# Patient Record
Sex: Male | Born: 1950
Health system: Southern US, Community
[De-identification: ages and names within clinical notes are randomized; demographics above are authoritative.]

## PROBLEM LIST (undated history)

## (undated) DIAGNOSIS — I2510 Atherosclerotic heart disease of native coronary artery without angina pectoris: Secondary | ICD-10-CM

## (undated) DIAGNOSIS — I10 Essential (primary) hypertension: Secondary | ICD-10-CM

## (undated) DIAGNOSIS — E785 Hyperlipidemia, unspecified: Secondary | ICD-10-CM

## (undated) DIAGNOSIS — C4491 Basal cell carcinoma of skin, unspecified: Secondary | ICD-10-CM

## (undated) DIAGNOSIS — M1990 Unspecified osteoarthritis, unspecified site: Secondary | ICD-10-CM

## (undated) HISTORY — PX: CORONARY ARTERY BYPASS GRAFT: SHX141

## (undated) HISTORY — PX: CARDIAC SURGERY: SHX584

## (undated) HISTORY — PX: OTHER SURGICAL HISTORY: SHX169

## (undated) HISTORY — DX: Essential (primary) hypertension: I10

## (undated) HISTORY — DX: Atherosclerotic heart disease of native coronary artery without angina pectoris: I25.10

## (undated) HISTORY — DX: Hyperlipidemia, unspecified: E78.5

## (undated) HISTORY — PX: VASECTOMY: SHX75

## (undated) HISTORY — PX: LASIK: SHX215

---
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
## 2011-01-07 ENCOUNTER — Inpatient Hospital Stay (HOSPITAL_COMMUNITY)
Admission: AD | Admit: 2011-01-07 | Discharge: 2011-01-13 | DRG: 234 | Disposition: A | Discharge status: Home / Self Care | Payer: Managed Care, Other (non HMO) | Source: Ambulatory Visit | Attending: Cardiothoracic Surgery | Admitting: Cardiothoracic Surgery

## 2011-01-07 ENCOUNTER — Inpatient Hospital Stay (HOSPITAL_COMMUNITY): Payer: Managed Care, Other (non HMO)

## 2011-01-07 DIAGNOSIS — I200 Unstable angina: Secondary | ICD-10-CM | POA: Diagnosis present

## 2011-01-07 DIAGNOSIS — I10 Essential (primary) hypertension: Secondary | ICD-10-CM | POA: Diagnosis present

## 2011-01-07 DIAGNOSIS — I2510 Atherosclerotic heart disease of native coronary artery without angina pectoris: Principal | ICD-10-CM | POA: Diagnosis present

## 2011-01-07 DIAGNOSIS — F411 Generalized anxiety disorder: Secondary | ICD-10-CM | POA: Diagnosis present

## 2011-01-07 DIAGNOSIS — R0602 Shortness of breath: Secondary | ICD-10-CM | POA: Diagnosis present

## 2011-01-07 DIAGNOSIS — H538 Other visual disturbances: Secondary | ICD-10-CM | POA: Diagnosis present

## 2011-01-07 DIAGNOSIS — D62 Acute posthemorrhagic anemia: Secondary | ICD-10-CM | POA: Diagnosis not present

## 2011-01-07 LAB — POCT I-STAT, CHEM 8
BUN: 7 mg/dL (ref 6–23)
Calcium, Ion: 1.12 mmol/L (ref 1.12–1.32)
Chloride: 105 mEq/L (ref 96–112)
HCT: 41 % (ref 39.0–52.0)
Sodium: 139 mEq/L (ref 135–145)

## 2011-01-07 LAB — BASIC METABOLIC PANEL
CO2: 24 mEq/L (ref 19–32)
Calcium: 9.1 mg/dL (ref 8.4–10.5)
Chloride: 104 mEq/L (ref 96–112)
GFR calc Af Amer: >60 mL/min (ref >60)
Glucose, Bld: 91 mg/dL (ref 70–99)
Potassium: 3.5 mEq/L (ref 3.5–5.1)
Sodium: 139 mEq/L (ref 135–145)

## 2011-01-07 LAB — CBC
HCT: 36.9 % — ABNORMAL LOW (ref 39.0–52.0)
Hemoglobin: 13.1 g/dL (ref 13.0–17.0)
MCH: 31.4 pg (ref 26.0–34.0)
MCV: 88.5 fL (ref 78.0–100.0)
Platelets: 242 10*3/uL (ref 150–400)
RBC: 4.17 MIL/uL — ABNORMAL LOW (ref 4.22–5.81)
WBC: 5.9 10*3/uL (ref 4.0–10.5)

## 2011-01-07 LAB — TSH: TSH: 1.443 u[IU]/mL (ref 0.350–4.500)

## 2011-01-07 LAB — BLOOD GAS, ARTERIAL
Acid-base deficit: 1.2 mmol/L (ref 0.0–2.0)
Bicarbonate: 22.3 mEq/L (ref 20.0–24.0)
O2 Saturation: 98.3 %
TCO2: 23.2 mmol/L (ref 0–100)
pCO2 arterial: 32.5 mmHg — ABNORMAL LOW (ref 35.0–45.0)
pO2, Arterial: 103 mmHg — ABNORMAL HIGH (ref 80.0–100.0)

## 2011-01-07 LAB — APTT: aPTT: 31 seconds (ref 24–37)

## 2011-01-07 LAB — CARDIAC PANEL(CRET KIN+CKTOT+MB+TROPI)
Relative Index: INVALID (ref 0.0–2.5)
Total CK: 131 U/L (ref 7–232)

## 2011-01-07 LAB — ABO/RH: ABO/RH(D): A POS

## 2011-01-07 LAB — PROTIME-INR: Prothrombin Time: 12.5 seconds (ref 11.6–15.2)

## 2011-01-07 LAB — TYPE AND SCREEN

## 2011-01-07 LAB — SURGICAL PCR SCREEN: MRSA, PCR: NEGATIVE

## 2011-01-07 LAB — POCT ACTIVATED CLOTTING TIME: Activated Clotting Time: 146 seconds

## 2011-01-08 ENCOUNTER — Inpatient Hospital Stay (HOSPITAL_COMMUNITY): Payer: Managed Care, Other (non HMO)

## 2011-01-08 DIAGNOSIS — I2510 Atherosclerotic heart disease of native coronary artery without angina pectoris: Secondary | ICD-10-CM

## 2011-01-08 DIAGNOSIS — Z01810 Encounter for preprocedural cardiovascular examination: Secondary | ICD-10-CM

## 2011-01-08 LAB — POCT I-STAT 3, ART BLOOD GAS (G3+)
Acid-base deficit: 2 mmol/L (ref 0.0–2.0)
Acid-base deficit: 5 mmol/L — ABNORMAL HIGH (ref 0.0–2.0)
Acid-base deficit: 3 mmol/L — ABNORMAL HIGH (ref 0.0–2.0)
Bicarbonate: 22.5 mEq/L (ref 20.0–24.0)
Bicarbonate: 22.5 mEq/L (ref 20.0–24.0)
O2 Saturation: 93 %
Patient temperature: 36.6
TCO2: 24 mmol/L (ref 0–100)
TCO2: 24 mmol/L (ref 0–100)
TCO2: 24 mmol/L (ref 0–100)
pCO2 arterial: 37.7 mmHg (ref 35.0–45.0)
pCO2 arterial: 31.7 mmHg — ABNORMAL LOW (ref 35.0–45.0)
pCO2 arterial: 39.5 mmHg (ref 35.0–45.0)
pCO2 arterial: 39.3 mmHg (ref 35.0–45.0)
pH, Arterial: 7.384 (ref 7.350–7.450)
pH, Arterial: 7.364 (ref 7.350–7.450)
pO2, Arterial: 306 mmHg — ABNORMAL HIGH (ref 80.0–100.0)
pO2, Arterial: 71 mmHg — ABNORMAL LOW (ref 80.0–100.0)
pO2, Arterial: 68 mmHg — ABNORMAL LOW (ref 80.0–100.0)
pO2, Arterial: 60 mmHg — ABNORMAL LOW (ref 80.0–100.0)

## 2011-01-08 LAB — URINALYSIS, ROUTINE W REFLEX MICROSCOPIC
Bilirubin Urine: NEGATIVE
Glucose, UA: NEGATIVE mg/dL
Hgb urine dipstick: NEGATIVE
Ketones, ur: NEGATIVE mg/dL
Nitrite: NEGATIVE
Protein, ur: NEGATIVE mg/dL
Specific Gravity, Urine: 1.014 (ref 1.005–1.030)
Urobilinogen, UA: 0.2 mg/dL (ref 0.0–1.0)
pH: 6 (ref 5.0–8.0)

## 2011-01-08 LAB — CREATININE, SERUM
Creatinine, Ser: 0.72 mg/dL (ref 0.4–1.5)
GFR calc Af Amer: >60 mL/min (ref >60)
GFR calc non Af Amer: >60 mL/min (ref >60)

## 2011-01-08 LAB — COMPREHENSIVE METABOLIC PANEL
ALT: 20 U/L (ref 0–53)
AST: 19 U/L (ref 0–37)
Albumin: 3.2 g/dL — ABNORMAL LOW (ref 3.5–5.2)
Alkaline Phosphatase: 72 U/L (ref 39–117)
BUN: 15 mg/dL (ref 6–23)
CO2: 26 mEq/L (ref 19–32)
Calcium: 9.1 mg/dL (ref 8.4–10.5)
Chloride: 105 mEq/L (ref 96–112)
Creatinine, Ser: 0.84 mg/dL (ref 0.4–1.5)
GFR calc Af Amer: >60 mL/min (ref >60)
GFR calc non Af Amer: >60 mL/min (ref >60)
Glucose, Bld: 101 mg/dL — ABNORMAL HIGH (ref 70–99)
Potassium: 4 mEq/L (ref 3.5–5.1)
Sodium: 139 mEq/L (ref 135–145)
Total Bilirubin: 0.2 mg/dL — ABNORMAL LOW (ref 0.3–1.2)
Total Protein: 6.5 g/dL (ref 6.0–8.3)

## 2011-01-08 LAB — CBC
HCT: 33.2 % — ABNORMAL LOW (ref 39.0–52.0)
HCT: 29.5 % — ABNORMAL LOW (ref 39.0–52.0)
Hemoglobin: 11.7 g/dL — ABNORMAL LOW (ref 13.0–17.0)
Hemoglobin: 10.4 g/dL — ABNORMAL LOW (ref 13.0–17.0)
MCH: 31.3 pg (ref 26.0–34.0)
MCH: 31 pg (ref 26.0–34.0)
MCHC: 35.2 g/dL (ref 30.0–36.0)
MCHC: 35.3 g/dL (ref 30.0–36.0)
MCV: 88.8 fL (ref 78.0–100.0)
MCV: 88.1 fL (ref 78.0–100.0)
Platelets: 265 10*3/uL (ref 150–400)
Platelets: 161 10*3/uL (ref 150–400)
Platelets: 154 10*3/uL (ref 150–400)
RBC: 4.45 MIL/uL (ref 4.22–5.81)
RBC: 3.74 MIL/uL — ABNORMAL LOW (ref 4.22–5.81)
RBC: 3.35 MIL/uL — ABNORMAL LOW (ref 4.22–5.81)
RDW: 12.7 % (ref 11.5–15.5)
RDW: 12.4 % (ref 11.5–15.5)
RDW: 12.4 % (ref 11.5–15.5)
WBC: 6.6 10*3/uL (ref 4.0–10.5)
WBC: 13.5 10*3/uL — ABNORMAL HIGH (ref 4.0–10.5)
WBC: 11.8 10*3/uL — ABNORMAL HIGH (ref 4.0–10.5)

## 2011-01-08 LAB — CARDIAC PANEL(CRET KIN+CKTOT+MB+TROPI)
CK, MB: 2.7 ng/mL (ref 0.3–4.0)
Relative Index: INVALID (ref 0.0–2.5)
Total CK: 64 U/L (ref 7–232)
Troponin I: <0.3 ng/mL (ref <0.30)

## 2011-01-08 LAB — POCT I-STAT 4, (NA,K, GLUC, HGB,HCT)
Glucose, Bld: 105 mg/dL — ABNORMAL HIGH (ref 70–99)
Glucose, Bld: 131 mg/dL — ABNORMAL HIGH (ref 70–99)
Glucose, Bld: 104 mg/dL — ABNORMAL HIGH (ref 70–99)
HCT: 36 % — ABNORMAL LOW (ref 39.0–52.0)
HCT: 35 % — ABNORMAL LOW (ref 39.0–52.0)
HCT: 29 % — ABNORMAL LOW (ref 39.0–52.0)
HCT: 31 % — ABNORMAL LOW (ref 39.0–52.0)
Hemoglobin: 11.9 g/dL — ABNORMAL LOW (ref 13.0–17.0)
Hemoglobin: 9.5 g/dL — ABNORMAL LOW (ref 13.0–17.0)
Hemoglobin: 10.5 g/dL — ABNORMAL LOW (ref 13.0–17.0)
Potassium: 3.6 mEq/L (ref 3.5–5.1)
Potassium: 4.4 mEq/L (ref 3.5–5.1)
Sodium: 140 mEq/L (ref 135–145)
Sodium: 134 mEq/L — ABNORMAL LOW (ref 135–145)
Sodium: 139 mEq/L (ref 135–145)
Sodium: 136 mEq/L (ref 135–145)

## 2011-01-08 LAB — LIPID PANEL
Cholesterol: 201 mg/dL — ABNORMAL HIGH (ref 0–200)
HDL: 35 mg/dL — ABNORMAL LOW (ref >39)
LDL Cholesterol: 140 mg/dL — ABNORMAL HIGH (ref 0–99)
Total CHOL/HDL Ratio: 5.7 RATIO
Triglycerides: 129 mg/dL (ref <150)
VLDL: 26 mg/dL (ref 0–40)

## 2011-01-08 LAB — PROTIME-INR
INR: 1.25 (ref 0.00–1.49)
Prothrombin Time: 15.9 seconds — ABNORMAL HIGH (ref 11.6–15.2)

## 2011-01-08 LAB — GLUCOSE, CAPILLARY

## 2011-01-08 LAB — POCT I-STAT, CHEM 8
BUN: 8 mg/dL (ref 6–23)
Creatinine, Ser: 0.9 mg/dL (ref 0.4–1.5)
Potassium: 4 mEq/L (ref 3.5–5.1)
Sodium: 138 mEq/L (ref 135–145)
TCO2: 23 mmol/L (ref 0–100)

## 2011-01-08 LAB — PLATELET COUNT: Platelets: 165 10*3/uL (ref 150–400)

## 2011-01-08 LAB — MAGNESIUM: Magnesium: 2.8 mg/dL — ABNORMAL HIGH (ref 1.5–2.5)

## 2011-01-08 LAB — HEMOGLOBIN A1C: Mean Plasma Glucose: 114 mg/dL (ref <117)

## 2011-01-08 LAB — APTT: aPTT: 35 seconds (ref 24–37)

## 2011-01-09 ENCOUNTER — Inpatient Hospital Stay (HOSPITAL_COMMUNITY): Payer: Managed Care, Other (non HMO)

## 2011-01-09 LAB — CBC
HCT: 28.8 % — ABNORMAL LOW (ref 39.0–52.0)
Hemoglobin: 10.3 g/dL — ABNORMAL LOW (ref 13.0–17.0)
Hemoglobin: 10 g/dL — ABNORMAL LOW (ref 13.0–17.0)
MCH: 31 pg (ref 26.0–34.0)
MCH: 31.3 pg (ref 26.0–34.0)
MCHC: 34.7 g/dL (ref 30.0–36.0)
MCV: 88.6 fL (ref 78.0–100.0)
MCV: 90.3 fL (ref 78.0–100.0)
Platelets: 167 10*3/uL (ref 150–400)
Platelets: 135 10*3/uL — ABNORMAL LOW (ref 150–400)
RBC: 3.32 MIL/uL — ABNORMAL LOW (ref 4.22–5.81)
RBC: 3.19 MIL/uL — ABNORMAL LOW (ref 4.22–5.81)
RDW: 12.9 % (ref 11.5–15.5)
WBC: 11.6 10*3/uL — ABNORMAL HIGH (ref 4.0–10.5)
WBC: 11.1 10*3/uL — ABNORMAL HIGH (ref 4.0–10.5)

## 2011-01-09 LAB — CREATININE, SERUM
Creatinine, Ser: 0.97 mg/dL (ref 0.4–1.5)
GFR calc Af Amer: >60 mL/min (ref >60)
GFR calc non Af Amer: >60 mL/min (ref >60)

## 2011-01-09 LAB — BASIC METABOLIC PANEL
CO2: 22 mEq/L (ref 19–32)
Calcium: 8 mg/dL — ABNORMAL LOW (ref 8.4–10.5)
Creatinine, Ser: 0.77 mg/dL (ref 0.4–1.5)
GFR calc Af Amer: >60 mL/min (ref >60)
GFR calc non Af Amer: >60 mL/min (ref >60)
Glucose, Bld: 116 mg/dL — ABNORMAL HIGH (ref 70–99)
Sodium: 136 mEq/L (ref 135–145)

## 2011-01-09 LAB — GLUCOSE, CAPILLARY
Glucose-Capillary: 108 mg/dL — ABNORMAL HIGH (ref 70–99)
Glucose-Capillary: 120 mg/dL — ABNORMAL HIGH (ref 70–99)
Glucose-Capillary: 128 mg/dL — ABNORMAL HIGH (ref 70–99)

## 2011-01-09 LAB — POCT I-STAT, CHEM 8
BUN: 13 mg/dL (ref 6–23)
Chloride: 102 mEq/L (ref 96–112)
Creatinine, Ser: 1.1 mg/dL (ref 0.4–1.5)
Sodium: 136 mEq/L (ref 135–145)

## 2011-01-09 LAB — MAGNESIUM: Magnesium: 2.5 mg/dL (ref 1.5–2.5)

## 2011-01-10 ENCOUNTER — Inpatient Hospital Stay (HOSPITAL_COMMUNITY): Payer: Managed Care, Other (non HMO)

## 2011-01-10 LAB — GLUCOSE, CAPILLARY: Glucose-Capillary: 114 mg/dL — ABNORMAL HIGH (ref 70–99)

## 2011-01-10 LAB — CBC
HCT: 27.3 % — ABNORMAL LOW (ref 39.0–52.0)
MCH: 31.2 pg (ref 26.0–34.0)
MCHC: 34.4 g/dL (ref 30.0–36.0)
MCV: 90.7 fL (ref 78.0–100.0)
RDW: 13 % (ref 11.5–15.5)

## 2011-01-10 LAB — BASIC METABOLIC PANEL
BUN: 13 mg/dL (ref 6–23)
Calcium: 8.3 mg/dL — ABNORMAL LOW (ref 8.4–10.5)
Creatinine, Ser: 0.89 mg/dL (ref 0.4–1.5)
GFR calc non Af Amer: >60 mL/min (ref >60)
Glucose, Bld: 110 mg/dL — ABNORMAL HIGH (ref 70–99)
Potassium: 3.7 mEq/L (ref 3.5–5.1)

## 2011-01-11 ENCOUNTER — Inpatient Hospital Stay (HOSPITAL_COMMUNITY): Payer: Managed Care, Other (non HMO)

## 2011-01-11 LAB — GLUCOSE, CAPILLARY
Glucose-Capillary: 107 mg/dL — ABNORMAL HIGH (ref 70–99)
Glucose-Capillary: 107 mg/dL — ABNORMAL HIGH (ref 70–99)

## 2011-01-11 LAB — CBC
Hemoglobin: 9.4 g/dL — ABNORMAL LOW (ref 13.0–17.0)
MCH: 31.4 pg (ref 26.0–34.0)
RBC: 2.99 MIL/uL — ABNORMAL LOW (ref 4.22–5.81)

## 2011-01-11 LAB — BASIC METABOLIC PANEL
CO2: 29 mEq/L (ref 19–32)
Chloride: 101 mEq/L (ref 96–112)
GFR calc Af Amer: >60 mL/min (ref >60)
Sodium: 136 mEq/L (ref 135–145)

## 2011-01-12 LAB — CBC
MCH: 31.4 pg (ref 26.0–34.0)
MCHC: 34.4 g/dL (ref 30.0–36.0)
Platelets: 209 10*3/uL (ref 150–400)
RBC: 3.22 MIL/uL — ABNORMAL LOW (ref 4.22–5.81)

## 2011-01-12 LAB — GLUCOSE, CAPILLARY: Glucose-Capillary: 112 mg/dL — ABNORMAL HIGH (ref 70–99)

## 2011-01-13 LAB — CBC
MCH: 31.5 pg (ref 26.0–34.0)
MCHC: 34.6 g/dL (ref 30.0–36.0)
Platelets: 280 10*3/uL (ref 150–400)
RBC: 3.4 MIL/uL — ABNORMAL LOW (ref 4.22–5.81)
RDW: 13 % (ref 11.5–15.5)

## 2011-01-15 LAB — GLUCOSE, CAPILLARY
Glucose-Capillary: 106 mg/dL — ABNORMAL HIGH (ref 70–99)
Glucose-Capillary: 108 mg/dL — ABNORMAL HIGH (ref 70–99)
Glucose-Capillary: 113 mg/dL — ABNORMAL HIGH (ref 70–99)
Glucose-Capillary: 118 mg/dL — ABNORMAL HIGH (ref 70–99)
Glucose-Capillary: 132 mg/dL — ABNORMAL HIGH (ref 70–99)

## 2011-01-16 NOTE — Op Note (Signed)
Derek Hayden, Derek NO.:  [id number]  MEDICAL RECORD NO.:  [id number]           PATIENT TYPE:  I  LOCATION:  2314                         FACILITY:  [facility]  PHYSICIAN:  Kerin Perna, M.D.  DATE OF BIRTH:  [birth year]  DATE OF PROCEDURE:  01/08/2011 DATE OF DISCHARGE:                              OPERATIVE REPORT   OPERATION: 1. Coronary artery bypass grafting x4 (left internal mammary artery to     LAD, saphenous vein graft to diagonal, saphenous vein graft to     obtuse marginal, saphenous vein graft to distal right coronary     artery/posterolateral). 2. Endoscopic harvest of right leg greater saphenous vein.  PREOPERATIVE DIAGNOSIS:  Severe left main and 3-vessel coronary artery disease with unstable angina.  POSTOPERATIVE DIAGNOSIS:  Severe left main and 3-vessel coronary artery disease with unstable angina.  SURGEON:  Kerin Perna, MD  ASSISTANT:  Sheliah Plane, MD and Doree Fudge, PA-C  ANESTHESIA:  General.  INDICATIONS:  The patient is a 60 year old Caucasian hypertensive male nonsmoker who presents with symptoms of unstable angina and was found by urgent cardiac cath to have significant 90% left main and severe 3- vessel disease with chronic occlusion of the LAD and circumflex.  His EF was 50%.  He is felt to be candidate for surgical revascularization.  I discussed the results of the cardiac cath with the patient and family and discussed the details of surgery including the indications, benefits, alternatives, and risks.  He understood the operation would be performed through a median sternotomy using cardiopulmonary bypass and general anesthesia.  He understood that the benefits would include prolonged survival and improved exercise tolerance and relief of angina. He understood the risk of the operation including bleeding, blood transfusion requirement, MI, stroke, infection, and death.  After reviewing these issues, he  agreed to proceed under what I felt was an informed consent.  OPERATIVE FINDINGS:  The right coronary was diffusely and heavily diseased with suboptimal target.  The distal graft was placed in the posterolateral branch of the distal right as the posterior descending was a small vessel with diffuse disease.  It also primarily coursed over the right ventricle and only crossed the septum at its very distal aspect.  The LAD and diagonal were fairly good targets but the chronically occluded distal circumflex was small but adequate.  The patient did not require any blood products for the surgery.  PROCEDURE:  The patient was brought to the operating room, placed supine on the operative table where general anesthesia was induced.  The chest, abdomen, and legs were prepped with Betadine and draped as a sterile field.  A sternal incision was made as the saphenous vein was harvested endoscopically from the right leg.  The left internal mammary artery was harvested as a pedicle graft from its origin at the subclavian vessels. It was a 1.5-mm vessel with excellent flow.  The sternal retractor was placed and heparin was administered.  The pericardium was opened and suspended.  Pursestrings were placed in the ascending aorta and right atrium and after the ACT had been documented as being  therapeutic and the vein was harvested, the patient was cannulated and placed on cardiopulmonary bypass.  The coronaries were identified for grafting and the mammary artery and vein grafts were prepared for the distal anastomoses.  Cardioplegic catheters were placed for both antegrade and retrograde cold blood cardioplegia.  The patient was cooled to 32 degrees and the aortic crossclamp was applied.  One liter of cold blood cardioplegia was delivered in split doses between the antegrade aortic and retrograde coronary sinus catheters. There was good cardioplegic arrest and septal temperature dropped less than 12  degrees.  Cardioplegia was delivered every 20 minutes or less while the crossclamp was placed.  The distal coronary anastomoses were then performed.  The first distal anastomosis was to the diagonal branch to LAD.  This was a 1.5-mm vessel, fairly good target, and a reverse saphenous vein was sewn end-to- side with running 7-0 Prolene with good flow through graft and cardioplegia was redosed through this graft to the anterior wall.  The second distal anastomosis was to the distal right coronary.  There was a early bifurcation of the posterior descending which coursed over the right ventricle and was small and had diffuse distal disease.  This was not grafted.  The graft was placed to the posterolateral branch of the distal right in an area of minimal plaque and there was an excellent patent vessel probe patent distally from the point of anastomosis distally.  A reverse saphenous vein at this point was sewn end-to-side with running 7-0 Prolene.  There was good flow through graft.  The third distal anastomosis was to the obtuse marginal branch of the left circumflex.  This was chronically occluded.  It was a 1.3 mm vessel. Reverse saphenous vein was then a side with running 7-0 Prolene with good flow through graft.  Cardioplegia was redosed.  The fourth distal anastomosis was the distal third of the LAD.  It was a 1.5-mm vessel and a fairly good target.  The left IMA pedicle was brought to an opening created in the left lateral pericardium and was brought down onto the LAD and sewn end-to-side with running 8-0 Prolene.  There was excellent flow through the anastomosis after briefly releasing the pedicle bulldog on the mammary artery.  The bulldog was reapplied and a pedicle was secured epicardium with 6-0 Prolene.  Cardioplegia was redosed.  While the crossclamp was still in place, 3 proximal vein anastomoses were performed on the ascending aorta using a 4.0-mm punch running 7-0 Prolene.   Prior to tying down the final proximal anastomosis, air was vented from the coronaries with a dose of retrograde warm blood cardioplegia.  The crossclamp was then removed.  The heart resumed a spontaneous rhythm.  The cardioplegic catheters were removed.  The proximal distal anastomoses were checked and found to be hemostatic.  Temporary pacing wires were applied.  While the patient was rewarmed to 37 degrees, the lungs re-expanded, the ventilator was resumed and the patient was weaned from bypass without difficulty on low-dose inotropes.  Cardiac output and blood pressure was stable.  The right ventricle at first appeared to be somewhat dilated and hypocontractile but then with further reperfusion became much more dynamic with good function.  Cardiac output was normal and protamine was administered without adverse reaction.  The cannula was removed.  The mediastinum was irrigated with warm saline.  The leg incision was irrigated and closed in a standard fashion.  The superior pericardial fat was closed over the aorta.  Two mediastinal and  left pleural chest tubes were placed and brought through separate incisions.  The sternum was closed with interrupted steel wire.  The pectoralis fascia and subcutaneous layers were closed with running Vicryl and sterile dressings were applied. Total bypass time was 140 minutes.     Kerin Perna, M.D.     PV/MEDQ  D:  01/08/2011  T:  01/09/2011  Job:  [id number]  cc:   Nanetta Batty, M.D. Larina Earthly, M.D.  Electronically Signed by Kerin Perna M.D. on 01/16/2011 09:45:46 AM

## 2011-01-16 NOTE — Cardiovascular Report (Signed)
NAMESHELLIE, GOETTL NO.:  [id number]  MEDICAL RECORD NO.:  [id number]           PATIENT TYPE:  I  LOCATION:  2914                         FACILITY:  [facility]  PHYSICIAN:  Nanetta Batty, M.D.   DATE OF BIRTH:  [birth year]  DATE OF PROCEDURE: DATE OF DISCHARGE:                           CARDIAC CATHETERIZATION   Mr. Delmonaco is a very pleasant 60 year old thin-appearing married Caucasian male with risk factors that include hypertension, who is very active and has complained of a 3-week history of exertional chest pain, shortness of breath and fatigue with decreased exercise tolerance.  He saw Dr. Felipa Eth today who documented anterolateral T-wave inversion.  He was admitted to the hospital for evaluation and cath.  DESCRIPTION OF PROCEDURE:  The patient was brought to the Second Floor [place] cardiac cath lab in postabsorptive state.  He was premedicated with p.o. Valium, IV Versed and fentanyl.  His right groin was prepped and shaved in the usual sterile fashion.  Xylocaine 1% was used for local anesthesia.  A 5-French sheath was inserted into the right femoral artery using standard Seldinger technique.  5-French right- left Judkins diagnostic catheter as well as 5-French pigtail catheter were used for selective coronary angiography, left ventriculography, subselective right and left internal mammary artery angiography and distal abdominal aortography.  Visipaque dye was used for the entirety of the case.  Retrograde aortic, left ventricular and pullback pressures were recorded.  HEMODYNAMICS: 1. Aortic systolic pressure 87, diastolic pressure 58. 2. Left ventricular systolic pressure 90, end-diastolic pressure 13.  SELECTIVE CORONARY ANGIOGRAPHY: 1. Left main; left main had a 90% tapered distal stenosis going into     the ostium and the circumflex and LAD both of which had 90% ostial     stenoses. 2. LAD; totalled after the first moderate-sized diagonal  branch and     septal perforator.  Filled by both right-to-left collaterals and     homocollaterals.  The diagonal branch had a 70% ostial stenosis as     well as 50% focal mid stenosis. 3. Left circumflex; occluded in its midportion with large distal     marginal branch filling by homocollaterals. 4. Right coronary artery; dominant with 70% mid, 60% distal, 90%     ostial PDA and 90% mid PDA.  The right system filled the LAD by     collaterals. 5. Left ventriculography; RAO left ventriculogram was performed using     25 mL of Visipaque dye at 12 mL per second.  The overall LVEF was     estimated approximately 50% with mild inferoapical hypokinesia. 6. Right and left internal mammary arteries; widely patent and     suitable for use during coronary artery bypass grafting. 7. Distal abdominal aortography; distal abdominal aortogram was     performed using 20 mL of Visipaque dye at 20 mL per second.  The     renal arteries were widely patent. The infrarenal abdominal aorta     and iliac bifurcation were free of significant atherosclerotic     changes.  IMPRESSION:  Mr. Sheils has high-grade left main three-vessel disease with preserved left  ventricular function.  He will need coronary artery bypass grafting either today or tomorrow by Dr. Kathlee Nations Trigt.  He has been notified of the case and is coming to see the patient.  Dr. Larina Earthly was notified of these results.     Nanetta Batty, M.D.     JB/MEDQ  D:  01/07/2011  T:  01/08/2011  Job:  [id number]  cc:   [facility] [place] [facility] Larina Earthly, M.D.  Electronically Signed by Nanetta Batty M.D. on 01/16/2011 03:43:33 PM

## 2011-01-16 NOTE — Consult Note (Signed)
NAMEDAN, SCEARCE NO.:  [id number]  MEDICAL RECORD NO.:  [id number]           PATIENT TYPE:  I  LOCATION:  2914                         FACILITY:  [facility]  PHYSICIAN:  Kerin Perna, M.D.  DATE OF BIRTH:  1950/10/02  DATE OF CONSULTATION:  01/07/2011 DATE OF DISCHARGE:                                CONSULTATION   REASON FOR CONSULTATION:  Severe left main and three-vessel coronary artery disease.  HISTORY OF PRESENT ILLNESS:  I was asked to evaluate this 60 year old Caucasian hypertensive male, nonsmoker who presents with recent 2-week history of exertional chest pain, shortness of breath, decreasing exercise tolerance, and increasing fatigue.  He has a history of hypertension and is a patient of Dr. Vicente Males.  Dr. Felipa Eth examined the patient in his office and obtained a 12-lead EKG, which showed new ST- segment changes with T-wave inversion in the precordial leads and T-wave inversion in the inferior leads.  He was sent directly to [facility]. Cardiac enzymes were checked and were negative.  He was in sinus rhythm. He underwent urgent cardiac catheterization by Dr. Nanetta Batty.  This demonstrated a 90% distal left main stenosis with 90% stenosis of the proximal LAD and circumflex.  The mid LAD and mid circumflex vessels were occluded.  They filled via collaterals from the right coronary artery.  There was a diagonal that had a ostial 70% to 80% stenosis as well.  The right coronary artery was dominant and was patent, but had a proximal mid 70% stenosis and a 90% stenosis at the takeoff of the posterior descending with some distal disease in the posterior descending as well.  EF was 50% with hypokinesia of the inferiorly apical wall.  LVEDP was normal.  There was no evidence of mitral regurgitation.  Based on the patient's symptoms and coronary anatomy, he was felt to be candidate for surgical evaluation and a thoracic surgical evaluation was  requested.  The patient was placed on IV heparin protocol as well as IV nitroglycerin.  PAST MEDICAL HISTORY: 1. Hypertension. 2. Allergy to ACE inhibitors (cough). 3. Prior surgical history including vasectomy and LASIK.  HOME MEDICATIONS:  Amlodipine 5 mg p.o. daily, aspirin 81 mg daily.  SOCIAL HISTORY:  The patient is married and works at [employer]. He does not smoke and uses alcohol occasionally.  FAMILY HISTORY:  His mother died of lymphoma at age 68.  His father has hypertension at age 45.  No history of premature coronary artery disease or severe hyperlipidemia.  REVIEW OF SYSTEMS:  CONSTITUTIONAL:  Negative for fever, weight loss. ENT:  Negative for change in vision, dental pain, or difficulty swallowing.  THORACIC:  Negative for history of thoracic trauma, abnormal chest x-ray, productive cough, or recent upper respiratory infection.  CARDIAC:  Positive for his angina.  No symptoms of CHF, murmur, or arrhythmia.  GI:  Negative for hepatitis, jaundice, or blood per rectum.  No history of previous colonoscopy.  UROLOGIC:  Negative for hematuria, kidney stones, or UTI.  VASCULAR:  Negative for DVT, claudication, or varicose veins.  NEUROLOGIC:  Negative stroke or seizure.  HEMATOLOGIC:  Negative bleeding disorder or blood transfusion. ENDOCRINE:  Negative diabetes or thyroid disease.  PHYSICAL EXAMINATION:  VITAL SIGNS:  The patient is 5 feet and 5 inches and weighs 195 pounds, blood pressure 140/80, pulse 80 in sinus, saturation 98% on room air, temperature 98.3. GENERAL APPEARANCE:  This is a very pleasant middle-aged Caucasian male in the CCU surrounded by family, no acute distress. HEENT:  Normocephalic.  Pupils are equal. NECK:  Without JVD, mass, or bruit. LYMPHATICS:  No palpable adenopathy in the neck or supraclavicular fossa. LUNGS:  Breath sounds are clear and there is no thoracic deformity. CARDIAC:  Regular rhythm without S3 gallop, murmur, or  rub. ABDOMEN:  Soft, nontender without pulsatile mass. EXTREMITIES:  No clubbing, cyanosis, or edema. VASCULAR:  Peripheral pulses are 2+ in all extremities.  No evidence of venous insufficiency in lower extremity. NEUROLOGIC:  Alert and oriented without focal motor deficit.  LABORATORY DATA:  I reviewed the coronary arteriogram with Dr. Allyson Sabal in the cath lab and he has severe multivessel disease with severe left main stenosis and fairly well-preserved LV function.  RECOMMENDATIONS:  He would benefit from surgical revascularization due to his severe coronary anatomy with bypass grafts to the LAD, diagonal, circumflex, and posterior descending.  I have discussed the procedure in detail with the patient including the indication, benefits, alternatives, and risks and he understands and agrees to proceed with surgery in the morning.     Kerin Perna, M.D.     PV/MEDQ  D:  01/07/2011  T:  01/07/2011  Job:  [id number]  cc:   Larina Earthly, M.D. Nanetta Batty, M.D.  Electronically Signed by Kerin Perna M.D. on 01/16/2011 09:45:38 AM

## 2011-01-16 NOTE — Discharge Summary (Signed)
Derek Hayden, Derek Hayden NO.:  [id number]  MEDICAL RECORD NO.:  [id number]           PATIENT TYPE:  I  LOCATION:  2005                         FACILITY:  [facility]  PHYSICIAN:  Kerin Perna, M.D.  DATE OF BIRTH:  18-Jun-1951  DATE OF ADMISSION:  01/07/2011 DATE OF DISCHARGE:                              DISCHARGE SUMMARY   PRIMARY ADMITTING DIAGNOSIS:  Chest pain.  ADDITIONAL/DISCHARGE DIAGNOSES: 1. Severe left main and three-vessel coronary artery disease. 2. Unstable angina. 3. History of hypertension. 4. Postoperative acute blood loss anemia.  PROCEDURES PERFORMED: 1. Cardiac catheterization. 2. Coronary artery bypass grafting x4 (left internal mammary artery to     the left anterior descending artery, saphenous vein graft to the     diagonal, saphenous vein graft to the obtuse marginal, saphenous     vein graft to the distal right coronary/posterolateral). 3. Endoscopic vein harvest, right leg.  HISTORY:  The patient is a 60 year old male with a known history of hypertension who is followed by Dr. Felipa Eth.  He recently presented with a 2-week history of exertional chest pain, shortness of breath, decreased exercise tolerance, and increased fatigue.  He was seen on the date of admission in Dr. Vicente Males office and a 12-lead EKG was performed, which showed new ST-segment changes with T-wave inversion in the precordial leads and T-wave inversion in the inferior leads.  He was sent directly to the Emergency Department at [facility] and cardiac enzymes were negative. He was taken to the Cardiac Cath Lab and underwent catheterization by Dr. Allyson Sabal.  This demonstrated a 90% distal left main stenosis with 90% stenosis of the proximal LAD and circumflex.  The mid LAD and mid circumflex vessels were occluded.  They filled via collaterals from the right coronary artery.  There was a diagonal that had a 70-80% ostial stenosis.  The right coronary artery was dominant and patent,  but had a proximal mid 70% stenosis and a 90% stenosis at the takeoff of the PD with some distal disease in the PD as well.  EF was 50% with hypokinesis of the inferior apical wall.  The patient was subsequently admitted for cardiac surgery consultation and workup.  HOSPITAL COURSE:  Mr. Cattell was admitted and was seen in consultation by Dr. Kathlee Nations Trigt.  Dr. Donata Clay reviewed his films and agreed with the need for coronary artery bypass grafting.  He explained all risks, benefits and alternatives of surgery to the patient.  He agreed to proceed.  He was taken to the operating room on Jan 08, 2011, and underwent coronary artery bypass grafting x4.  Please see previously dictated operative report for complete details of surgery.  He tolerated the procedure well and was transferred to the SICU in stable condition. He was able to be extubated shortly after surgery.  He was hemodynamically stable and doing well on postop day #1.  He did require low-dose pressors for hypotension, but these were weaned anddiscontinued late in the day of postop day #1.  By postop day #2, he was ready for transfer to the Step-Down Unit.  Overall, his postoperative course has  been uneventful.  He has maintained sinus rhythm, has been afebrile, and his vital signs have been stable.  He has been started on Lasix to which he is responding well for mild volume overload.  He is ambulating in the halls without difficulty.  He is tolerating a regular diet and is having normal bowel and bladder function.  His most recent labs show a hemoglobin of 10.1, hematocrit 29.4, platelets 209, white count 8.0.  Sodium 136, potassium 3.9, BUN 13, creatinine 0.84.  His latest chest x-ray shows small bilateral pleural effusions.  Overall, he is progressing well.  It is anticipated that if he remained stable over the next 24 hours, he will be ready for discharge home on Jan 13, 2011.  DISCHARGE MEDICATIONS: 1. Enteric-coated  aspirin 325 mg daily. 2. Lasix 40 mg daily x1 week. 3. Lopressor 12.5 mg b.i.d. 4. Oxycodone IR 5-10 mg q.3-4 h. p.r.n. for pain. 5. Potassium 20 mEq daily x7 days. 6. Crestor 20 mg daily. 7. Tylenol 325 three tablets daily p.r.n. for pain. 8. Ibuprofen 300 mg three tablets daily p.r.n. for pain. 9. Multivitamin daily.  DISCHARGE INSTRUCTIONS:  He is asked to refrain from driving, heavy lifting, or strenuous activity.  He may continue ambulating daily and using his incentive spirometer.  He may shower daily and clean his incisions with soap and water.  He will continue a low-fat, low-sodium diet.  DISCHARGE FOLLOWUP:  He will need to make an appointment to see Dr. Allyson Sabal in 2 weeks.  He will then follow up with Dr. Donata Clay in 3 weeks with a chest x-ray.  In the interim if he experiences any problems or has questions, he is asked to contact our office immediately.     Coral Ceo, P.A.   ______________________________ Kerin Perna, M.D.    GC/MEDQ  D:  01/12/2011  T:  01/12/2011  Job:  [id number]  cc:   Nanetta Batty, M.D. Larina Earthly, M.D.  Electronically Signed by Weldon Inches. on 01/14/2011 09:39:49 AM Electronically Signed by Kerin Perna M.D. on 01/16/2011 09:45:53 AM

## 2011-02-06 ENCOUNTER — Other Ambulatory Visit: Payer: Self-pay | Admitting: Cardiothoracic Surgery

## 2011-02-06 DIAGNOSIS — I2510 Atherosclerotic heart disease of native coronary artery without angina pectoris: Secondary | ICD-10-CM

## 2011-02-07 ENCOUNTER — Encounter: Payer: Managed Care, Other (non HMO) | Admitting: Cardiothoracic Surgery

## 2011-02-07 ENCOUNTER — Encounter (INDEPENDENT_AMBULATORY_CARE_PROVIDER_SITE_OTHER): Payer: Self-pay | Admitting: Cardiothoracic Surgery

## 2011-02-07 ENCOUNTER — Ambulatory Visit
Admission: RE | Admit: 2011-02-07 | Discharge: 2011-02-07 | Disposition: A | Discharge status: Home / Self Care | Payer: Managed Care, Other (non HMO) | Source: Ambulatory Visit | Attending: Cardiothoracic Surgery | Admitting: Cardiothoracic Surgery

## 2011-02-07 DIAGNOSIS — I2510 Atherosclerotic heart disease of native coronary artery without angina pectoris: Secondary | ICD-10-CM

## 2011-02-17 ENCOUNTER — Encounter (HOSPITAL_COMMUNITY): Payer: Managed Care, Other (non HMO) | Attending: Cardiovascular Disease

## 2011-02-17 ENCOUNTER — Encounter (HOSPITAL_COMMUNITY): Payer: Managed Care, Other (non HMO)

## 2011-02-17 ENCOUNTER — Ambulatory Visit (HOSPITAL_COMMUNITY): Payer: Managed Care, Other (non HMO)

## 2011-02-17 DIAGNOSIS — Z951 Presence of aortocoronary bypass graft: Secondary | ICD-10-CM | POA: Insufficient documentation

## 2011-02-17 DIAGNOSIS — F411 Generalized anxiety disorder: Secondary | ICD-10-CM | POA: Insufficient documentation

## 2011-02-17 DIAGNOSIS — I10 Essential (primary) hypertension: Secondary | ICD-10-CM | POA: Insufficient documentation

## 2011-02-17 DIAGNOSIS — I2510 Atherosclerotic heart disease of native coronary artery without angina pectoris: Secondary | ICD-10-CM | POA: Insufficient documentation

## 2011-02-17 DIAGNOSIS — I200 Unstable angina: Secondary | ICD-10-CM | POA: Insufficient documentation

## 2011-02-17 DIAGNOSIS — Z5189 Encounter for other specified aftercare: Secondary | ICD-10-CM | POA: Insufficient documentation

## 2011-02-19 ENCOUNTER — Encounter (HOSPITAL_COMMUNITY): Payer: Managed Care, Other (non HMO)

## 2011-02-21 ENCOUNTER — Encounter (HOSPITAL_COMMUNITY): Payer: Managed Care, Other (non HMO)

## 2011-02-24 ENCOUNTER — Encounter (HOSPITAL_COMMUNITY): Payer: Managed Care, Other (non HMO) | Attending: Cardiovascular Disease

## 2011-02-24 ENCOUNTER — Encounter (HOSPITAL_COMMUNITY): Payer: Managed Care, Other (non HMO)

## 2011-02-24 DIAGNOSIS — Z5189 Encounter for other specified aftercare: Secondary | ICD-10-CM | POA: Insufficient documentation

## 2011-02-24 DIAGNOSIS — I2510 Atherosclerotic heart disease of native coronary artery without angina pectoris: Secondary | ICD-10-CM | POA: Insufficient documentation

## 2011-02-24 DIAGNOSIS — I200 Unstable angina: Secondary | ICD-10-CM | POA: Insufficient documentation

## 2011-02-24 DIAGNOSIS — Z951 Presence of aortocoronary bypass graft: Secondary | ICD-10-CM | POA: Insufficient documentation

## 2011-02-24 DIAGNOSIS — I10 Essential (primary) hypertension: Secondary | ICD-10-CM | POA: Insufficient documentation

## 2011-02-24 DIAGNOSIS — F411 Generalized anxiety disorder: Secondary | ICD-10-CM | POA: Insufficient documentation

## 2011-02-26 ENCOUNTER — Encounter (HOSPITAL_COMMUNITY): Payer: Managed Care, Other (non HMO)

## 2011-02-28 ENCOUNTER — Encounter (HOSPITAL_COMMUNITY): Payer: Managed Care, Other (non HMO)

## 2011-03-03 ENCOUNTER — Encounter (HOSPITAL_COMMUNITY): Payer: Managed Care, Other (non HMO)

## 2011-03-04 NOTE — Assessment & Plan Note (Addendum)
OFFICE VISIT  Derek Hayden, Derek Hayden DOB:  [birth year]                                        February 09, 2011 CHART #:  [id number]  CURRENT PROBLEMS: 1. Status post coronary artery bypass grafting x4 on Jan 08, 2011, for     unstable angina and three-vessel disease. 2. Hypertension.  PRESENT ILLNESS:  Derek Hayden returns for his postop visit after undergoing multivessel vessel bypass grafting a month ago for unstable angina and three-vessel disease.  He has done well at home and is walking 50 minutes twice a day without recurrent angina.  The surgical incisions are healing well.  He remains on his discharge medications which includes Lopressor recently increased to 12.5 mg p.o. t.i.d. by Dr. Allyson Sabal, aspirin, Crestor 20 mg, multivitamin, and Cozaar 25 mg a day. He also takes aspirin 325 a day.  PHYSICAL EXAMINATION:  VITAL SIGNS:  Blood pressure 130/90, pulse 100, respirations 18, and saturation 98%. GENERAL:  He is alert and pleasant. LUNGS:  Breath sounds are clear and equal. HEART:  Cardiac rhythm is regular without murmur. SKIN:  The chest and leg incision is well healed.  There is no peripheral edema.  PA and lateral chest x-ray reveals clear lung fields.  No significant pleural effusion.  His cardiac silhouette is normal and stable.  PLAN:  The patient was told he could resume driving with activities up to 15-20 pounds.  He will enter cardiac rehab.  He has a very physical job at [employer] and before he returns to work we will reexamine the patient in August 2012.  Kerin Perna, M.D. Electronically Signed  PV/MEDQ  D:  02/09/2011  T:  02/09/2011  Job:  [id number]  cc:   Nanetta Batty, M.D.

## 2011-03-05 ENCOUNTER — Encounter (HOSPITAL_COMMUNITY): Payer: Managed Care, Other (non HMO)

## 2011-03-07 ENCOUNTER — Encounter (HOSPITAL_COMMUNITY): Payer: Managed Care, Other (non HMO)

## 2011-03-10 ENCOUNTER — Encounter (HOSPITAL_COMMUNITY): Payer: Managed Care, Other (non HMO)

## 2011-03-12 ENCOUNTER — Encounter (HOSPITAL_COMMUNITY): Payer: Managed Care, Other (non HMO)

## 2011-03-14 ENCOUNTER — Encounter (HOSPITAL_COMMUNITY): Payer: Managed Care, Other (non HMO)

## 2011-03-17 ENCOUNTER — Encounter (HOSPITAL_COMMUNITY): Payer: Managed Care, Other (non HMO)

## 2011-03-19 ENCOUNTER — Encounter (HOSPITAL_COMMUNITY): Payer: Managed Care, Other (non HMO)

## 2011-03-21 ENCOUNTER — Encounter (HOSPITAL_COMMUNITY): Payer: Managed Care, Other (non HMO)

## 2011-03-24 ENCOUNTER — Encounter (HOSPITAL_COMMUNITY): Payer: Managed Care, Other (non HMO)

## 2011-03-26 ENCOUNTER — Encounter (HOSPITAL_COMMUNITY): Admission: RE | Admit: 2011-03-26 | Payer: Managed Care, Other (non HMO) | Source: Ambulatory Visit

## 2011-03-26 ENCOUNTER — Encounter (HOSPITAL_COMMUNITY): Payer: Managed Care, Other (non HMO)

## 2011-03-26 DIAGNOSIS — I10 Essential (primary) hypertension: Secondary | ICD-10-CM | POA: Insufficient documentation

## 2011-03-26 DIAGNOSIS — I200 Unstable angina: Secondary | ICD-10-CM | POA: Insufficient documentation

## 2011-03-26 DIAGNOSIS — Z5189 Encounter for other specified aftercare: Secondary | ICD-10-CM | POA: Insufficient documentation

## 2011-03-26 DIAGNOSIS — Z951 Presence of aortocoronary bypass graft: Secondary | ICD-10-CM | POA: Insufficient documentation

## 2011-03-26 DIAGNOSIS — F411 Generalized anxiety disorder: Secondary | ICD-10-CM | POA: Insufficient documentation

## 2011-03-26 DIAGNOSIS — I2510 Atherosclerotic heart disease of native coronary artery without angina pectoris: Secondary | ICD-10-CM | POA: Insufficient documentation

## 2011-03-28 ENCOUNTER — Encounter (HOSPITAL_COMMUNITY): Payer: Managed Care, Other (non HMO) | Attending: Cardiovascular Disease

## 2011-03-28 ENCOUNTER — Encounter (HOSPITAL_COMMUNITY): Payer: Managed Care, Other (non HMO)

## 2011-03-31 ENCOUNTER — Encounter (HOSPITAL_COMMUNITY): Payer: Managed Care, Other (non HMO)

## 2011-04-02 ENCOUNTER — Encounter (HOSPITAL_COMMUNITY): Payer: Managed Care, Other (non HMO)

## 2011-04-02 ENCOUNTER — Encounter (INDEPENDENT_AMBULATORY_CARE_PROVIDER_SITE_OTHER): Payer: Self-pay | Admitting: Cardiothoracic Surgery

## 2011-04-02 DIAGNOSIS — I2510 Atherosclerotic heart disease of native coronary artery without angina pectoris: Secondary | ICD-10-CM

## 2011-04-03 HISTORY — PX: OTHER SURGICAL HISTORY: SHX169

## 2011-04-03 NOTE — Assessment & Plan Note (Signed)
OFFICE VISIT  Derek Hayden, Derek Hayden DOB:  [birth year]                                        April 02, 2011 CHART #:  [id number]  PRESENT ILLNESS:  The patient is very nice 60 year old gentleman who returns for his final postop office visit 3 months after undergoing urgent multivessel bypass grafting for severe three-vessel coronary disease with unstable angina.  He has done well following surgery with good exercise tolerance and is undergoing cardiac rehab which he enjoys. He walks daily as well.  He has had no angina or symptoms of CHF.  The surgical incisions are well healed.  He remains on his excellent cardiac medications including Lopressor 12.5 b.i.d., aspirin 325 daily, Crestor 20 mg a day and Cozaar 25 mg daily.  PHYSICAL EXAMINATION:  Blood pressure is 110/70, pulse 80, saturation 98%.  He is alert and comfortable.  Breath sounds are clear.  The sternum is stable and well healed.  Cardiac rhythm is regular without gallop or rub.  There is no pedal edema.  IMPRESSION AND PLAN:  We had a long discussion about his return to work. His current job description requires lifting in excess of 50 pounds with stooping and exposure to extreme heat with proximity to a 500-degree furnace.  This type of job really is unrealistic for a 60 year old man after heart bypass surgery, even though he is feeling well now.  He was encouraged to try to find a different job description.  Otherwise, he can resume with his rehab activities and gradually increase his exercise level and lifting amounts.  I will see him back as needed.  He has a stress test scheduled with Dr. Allyson Sabal tomorrow.  Kerin Perna, M.D. Electronically Signed  PV/MEDQ  D:  04/02/2011  T:  04/03/2011  Job:  [id number]  cc:   Nanetta Batty, M.D.

## 2011-04-04 ENCOUNTER — Encounter (HOSPITAL_COMMUNITY): Payer: Managed Care, Other (non HMO)

## 2011-04-07 ENCOUNTER — Ambulatory Visit (AMBULATORY_SURGERY_CENTER): Payer: Managed Care, Other (non HMO) | Admitting: *Deleted

## 2011-04-07 ENCOUNTER — Telehealth: Payer: Self-pay | Admitting: *Deleted

## 2011-04-07 ENCOUNTER — Encounter (HOSPITAL_COMMUNITY): Payer: Managed Care, Other (non HMO)

## 2011-04-07 VITALS — Ht 65.0 in | Wt 188.5 lb

## 2011-04-07 DIAGNOSIS — Z1211 Encounter for screening for malignant neoplasm of colon: Secondary | ICD-10-CM

## 2011-04-07 MED ORDER — PEG-KCL-NACL-NASULF-NA ASC-C 100 G PO SOLR: 1.0000 | Freq: Once | ORAL | Status: DC

## 2011-04-07 NOTE — Telephone Encounter (Signed)
Dr. Marina Goodell, I just wanted to let you know this pt had a recent CABG x4 on 01-08-11.  He is only on ASA 162mg  daily; no blood thinners.  I just wanted you to be aware before his procedure.  His colonoscopy is 04-17-11.  Thanks, [employer]

## 2011-04-08 NOTE — Telephone Encounter (Signed)
Ok thanks 

## 2011-04-09 ENCOUNTER — Encounter (HOSPITAL_COMMUNITY): Payer: Managed Care, Other (non HMO)

## 2011-04-11 ENCOUNTER — Encounter (HOSPITAL_COMMUNITY): Payer: Managed Care, Other (non HMO)

## 2011-04-14 ENCOUNTER — Encounter (HOSPITAL_COMMUNITY): Payer: Managed Care, Other (non HMO)

## 2011-04-16 ENCOUNTER — Encounter (HOSPITAL_COMMUNITY): Payer: Managed Care, Other (non HMO)

## 2011-04-17 ENCOUNTER — Encounter: Payer: Self-pay | Admitting: Internal Medicine

## 2011-04-17 ENCOUNTER — Ambulatory Visit (AMBULATORY_SURGERY_CENTER): Payer: Managed Care, Other (non HMO) | Admitting: Internal Medicine

## 2011-04-17 DIAGNOSIS — K5730 Diverticulosis of large intestine without perforation or abscess without bleeding: Secondary | ICD-10-CM

## 2011-04-17 DIAGNOSIS — D126 Benign neoplasm of colon, unspecified: Secondary | ICD-10-CM

## 2011-04-17 DIAGNOSIS — Z1211 Encounter for screening for malignant neoplasm of colon: Secondary | ICD-10-CM

## 2011-04-17 MED ORDER — SODIUM CHLORIDE 0.9 % IV SOLN: 500.0000 mL | INTRAVENOUS | Status: DC

## 2011-04-17 NOTE — Patient Instructions (Signed)
Read the handouts given to you by your recovery room nurse.    Your biopsy results will be mailed to you within 2 weeks.   Increase your fiber in your diet due to the diverticulosis in your colon and your hemorrhoids.   Resume your routine medications today.   If you have any questions, please call us at [phone].  Thanks-you.

## 2011-04-18 ENCOUNTER — Encounter (HOSPITAL_COMMUNITY): Payer: Managed Care, Other (non HMO)

## 2011-04-18 ENCOUNTER — Telehealth: Payer: Self-pay | Admitting: *Deleted

## 2011-04-18 NOTE — Telephone Encounter (Signed)

## 2011-04-21 ENCOUNTER — Encounter (HOSPITAL_COMMUNITY): Payer: Managed Care, Other (non HMO)

## 2011-04-23 ENCOUNTER — Encounter (HOSPITAL_COMMUNITY): Payer: Managed Care, Other (non HMO)

## 2011-04-25 ENCOUNTER — Encounter (HOSPITAL_COMMUNITY): Payer: Managed Care, Other (non HMO)

## 2011-04-28 ENCOUNTER — Encounter (HOSPITAL_COMMUNITY): Payer: Managed Care, Other (non HMO)

## 2011-04-30 ENCOUNTER — Encounter (HOSPITAL_COMMUNITY): Payer: Managed Care, Other (non HMO)

## 2011-05-02 ENCOUNTER — Encounter (HOSPITAL_COMMUNITY): Payer: Managed Care, Other (non HMO)

## 2011-05-05 ENCOUNTER — Encounter (HOSPITAL_COMMUNITY): Payer: Managed Care, Other (non HMO)

## 2011-05-07 ENCOUNTER — Encounter (HOSPITAL_COMMUNITY): Payer: Managed Care, Other (non HMO)

## 2011-05-09 ENCOUNTER — Encounter (HOSPITAL_COMMUNITY): Payer: Managed Care, Other (non HMO)

## 2011-05-12 ENCOUNTER — Encounter (HOSPITAL_COMMUNITY): Payer: Managed Care, Other (non HMO)

## 2011-05-14 ENCOUNTER — Encounter (HOSPITAL_COMMUNITY): Payer: Managed Care, Other (non HMO)

## 2011-05-16 ENCOUNTER — Encounter (HOSPITAL_COMMUNITY): Payer: Managed Care, Other (non HMO)

## 2011-05-19 ENCOUNTER — Encounter (HOSPITAL_COMMUNITY): Payer: Managed Care, Other (non HMO)

## 2011-05-21 ENCOUNTER — Encounter (HOSPITAL_COMMUNITY): Payer: Managed Care, Other (non HMO)

## 2011-05-23 ENCOUNTER — Encounter (HOSPITAL_COMMUNITY): Payer: Managed Care, Other (non HMO)

## 2012-04-29 IMAGING — CR DG CHEST 1V PORT
1 series · 1 of 1 positions shown · non-contrast
Comparison: Portable exam 8167 hours compared to earlier study of
01/08/2011

CLINICAL DATA: Coronary artery disease post CABG

PORTABLE CHEST - 1 VIEW

[view not recorded]
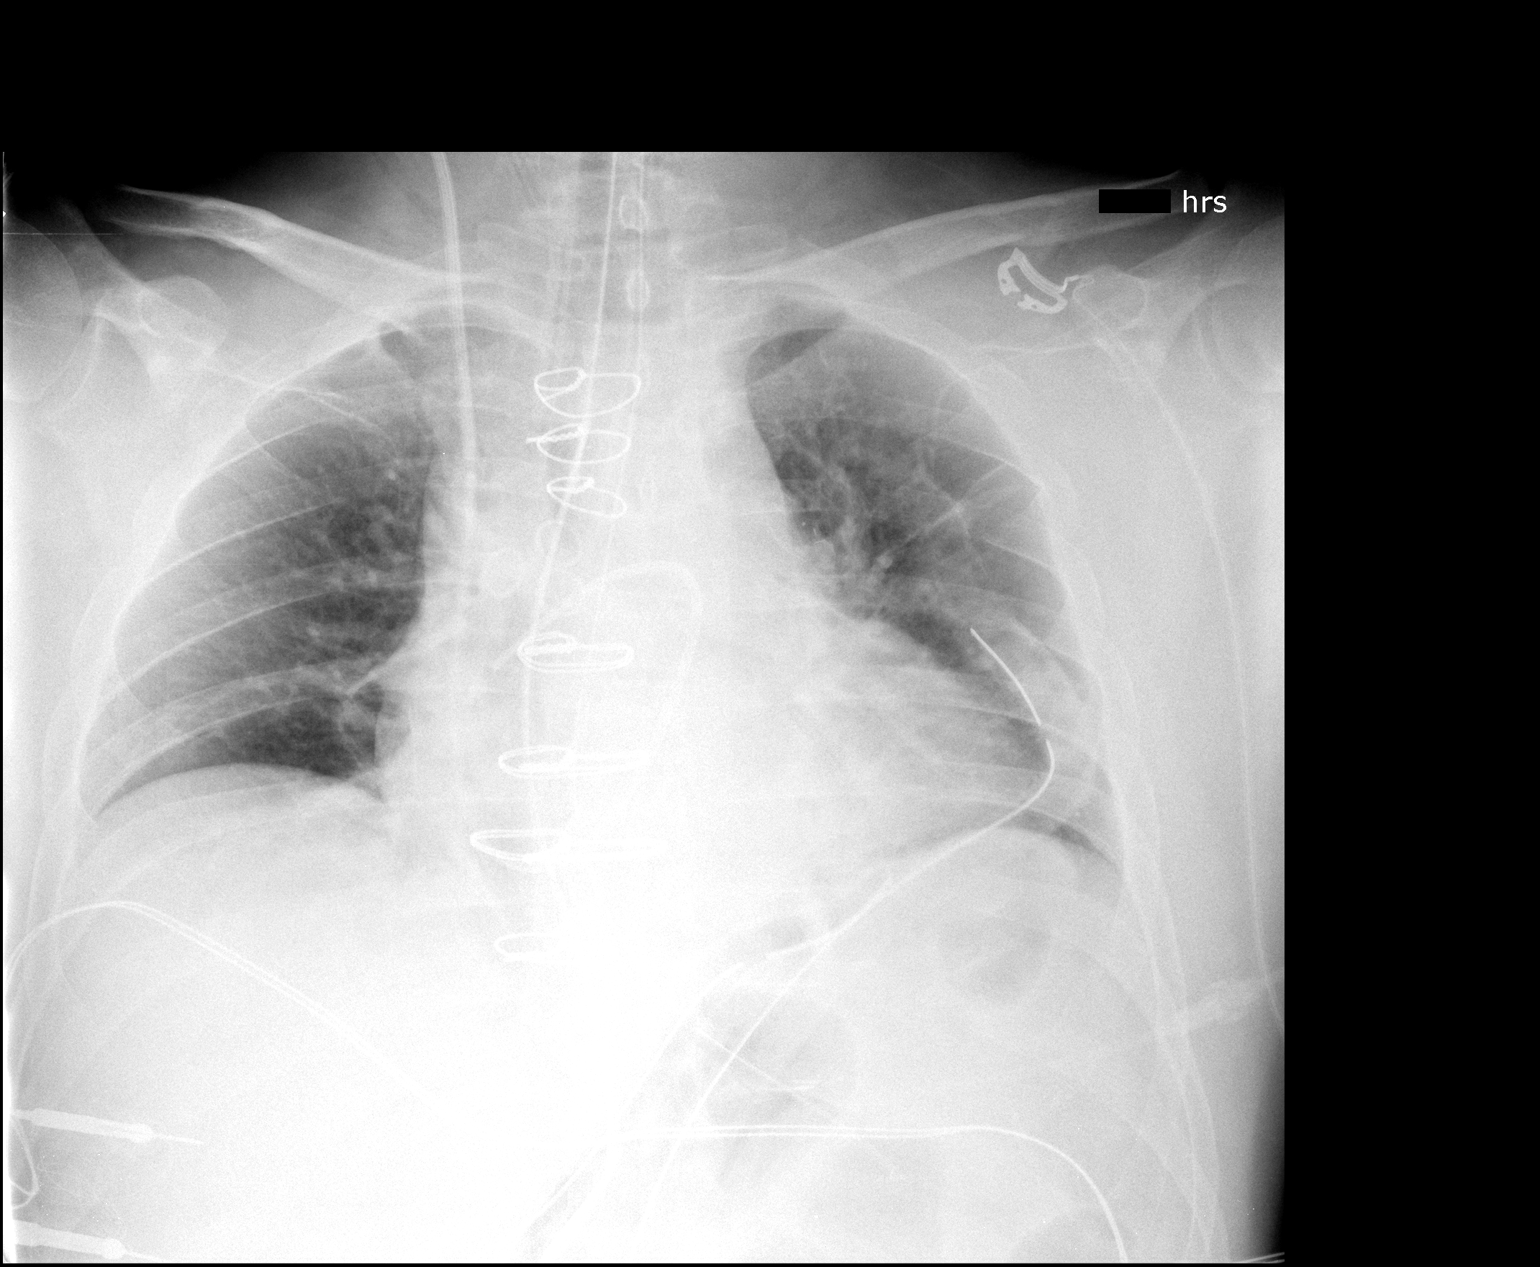

[1 of 1 positions shown; findings below may reference images not displayed]

FINDINGS: Tip of endotracheal tube 2.6 cm above carina.
Nasogastric tube extends into stomach.
Mediastinal drain and left thoracostomy tube present.
Right jugular Swan-Ganz catheter, tip at descending interlobar
right pulmonary artery.
Decreased lung volumes with atelectasis in left mid lung and at
right base.
No definite pneumothorax.
Cardiac silhouette and mediastinum are prominent compatible with
preceding surgery.
Probable skin fold lower right chest.
IMPRESSION: Postoperative changes as above.

## 2012-04-29 IMAGING — CR DG CERVICAL SPINE FLEX&EXT ONLY
2 series · 2 of 2 positions shown · non-contrast
Comparison: None

CLINICAL DATA: Neck pain with right arm pain

CERVICAL SPINE - FLEXION AND EXTENSION VIEWS ONLY

[w c-spine flexion *]
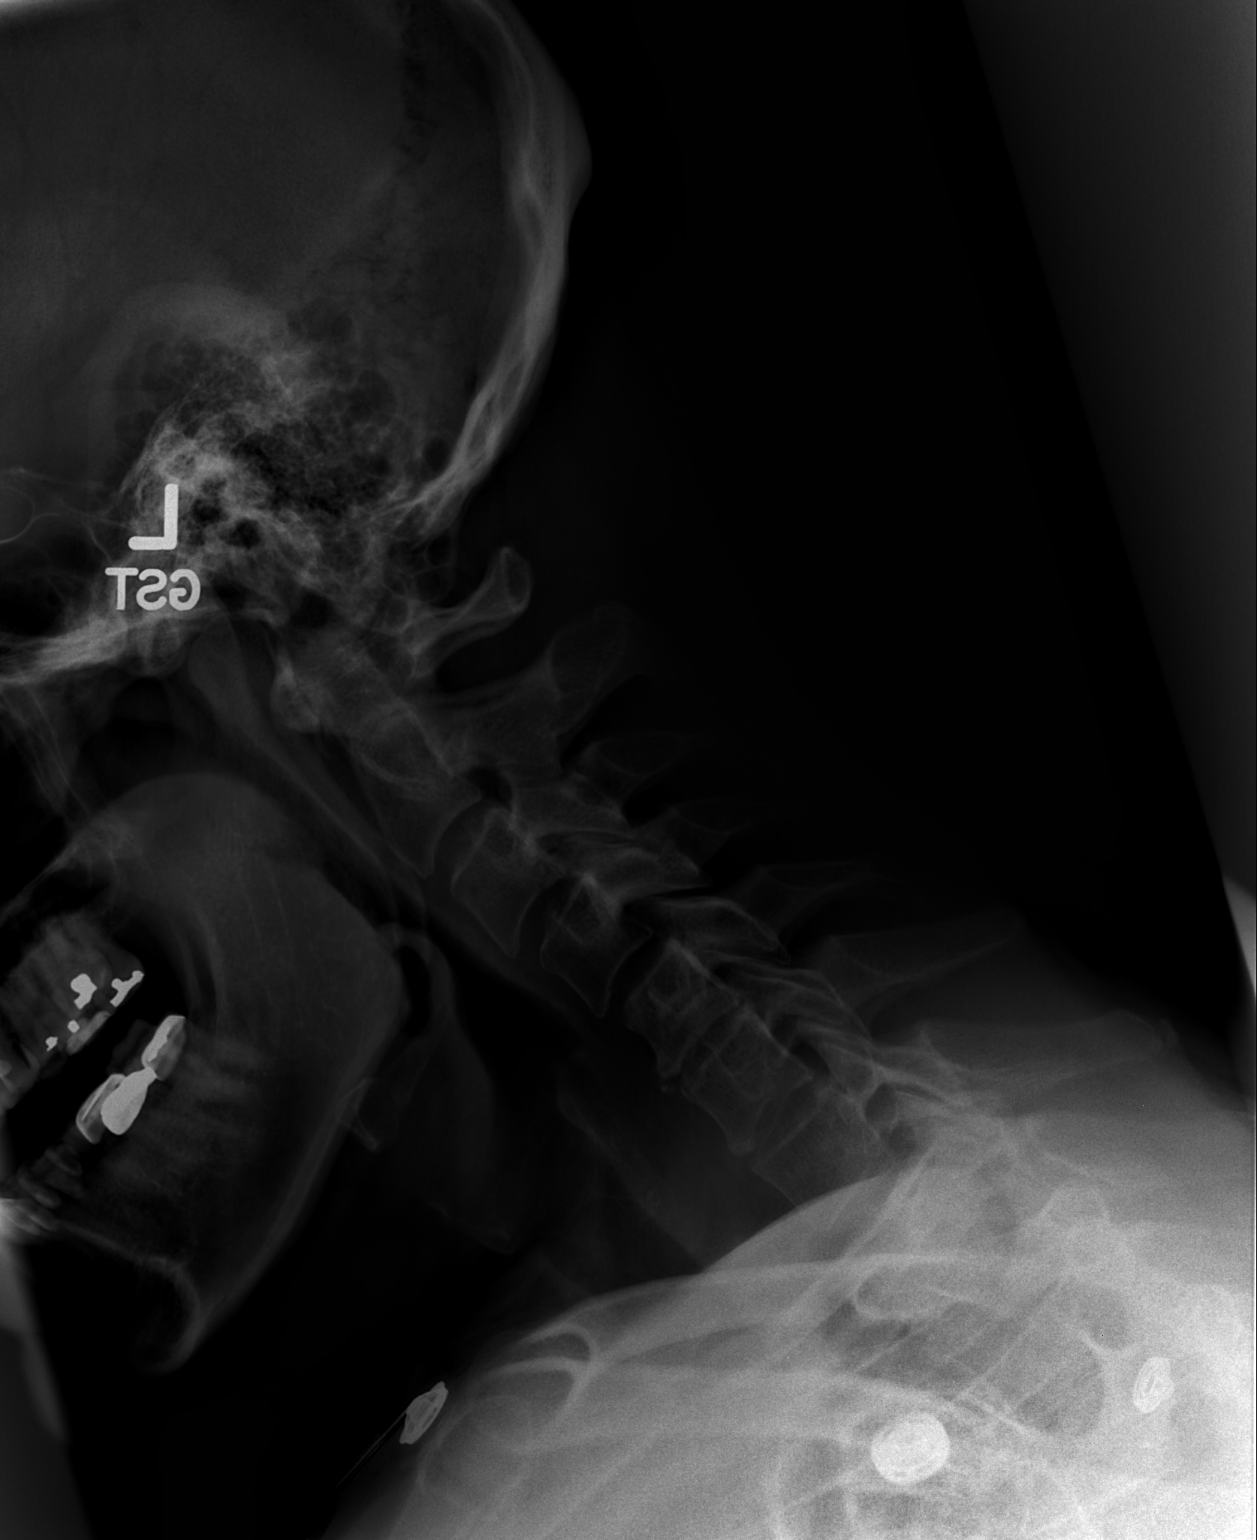

[w c-spine extension *]
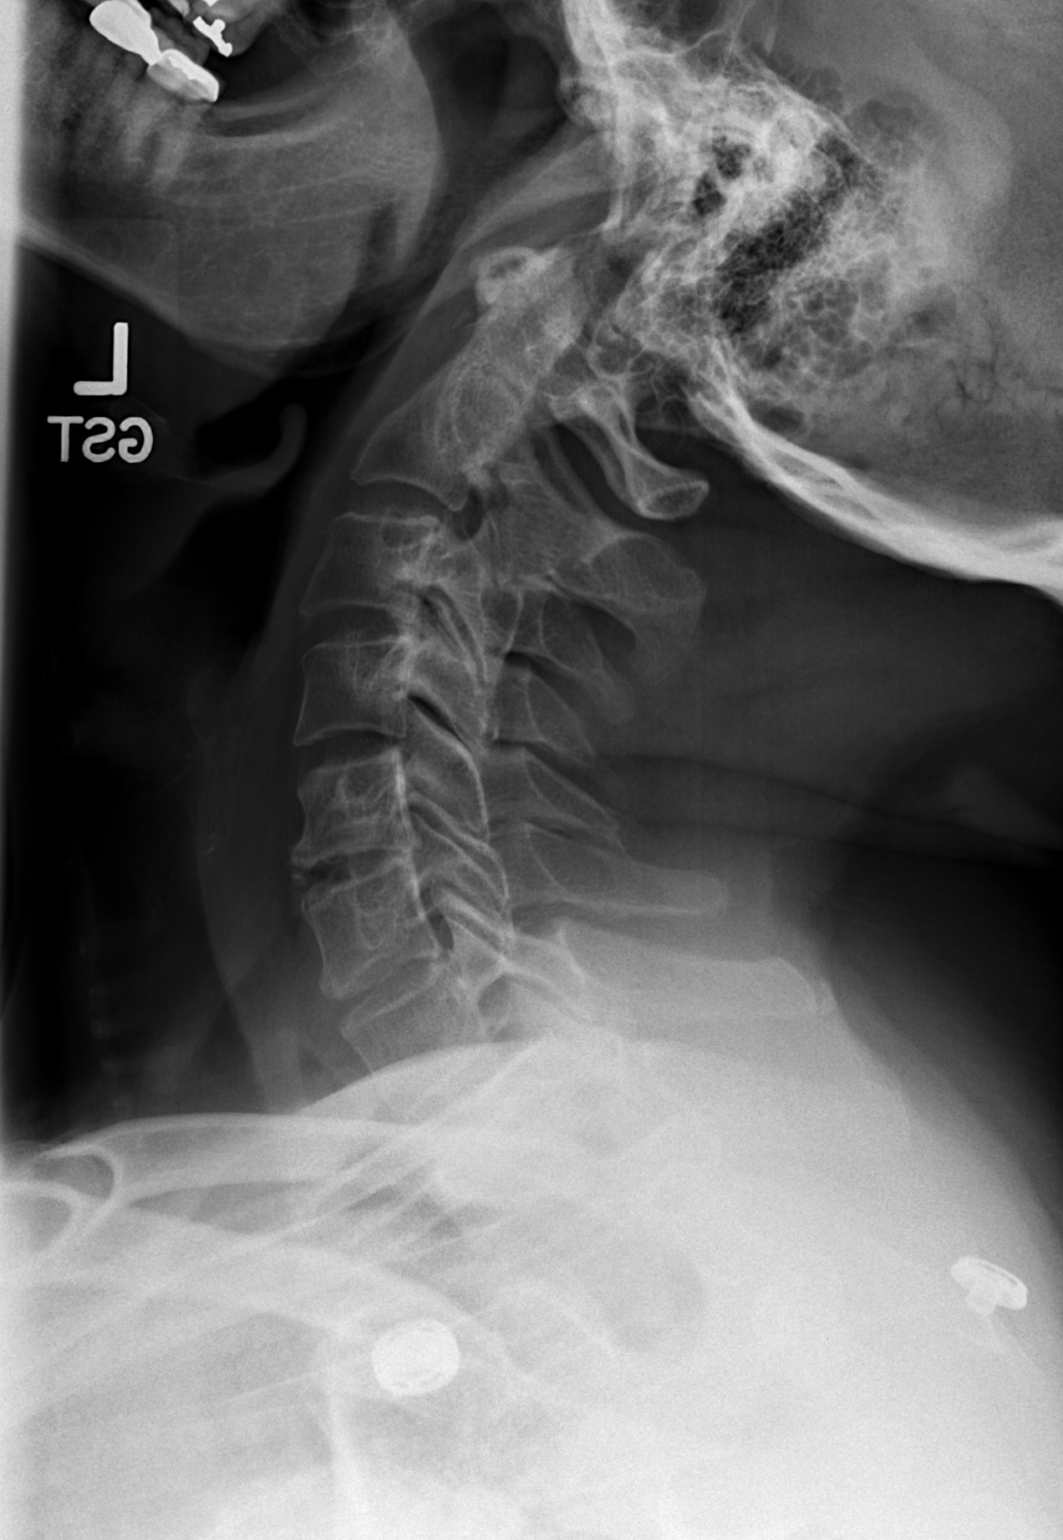

[2 of 2 positions shown; findings below may reference images not displayed]

FINDINGS: Disc degeneration and spurring C5-6.

Mild anterior subluxation C3 on C4 and C4 on C5 with flexion.  This
reduces on extension and  is most likely physiologic in nature.  No
fracture is identified.
IMPRESSION: Disc degeneration and spondylosis C5-6.  Physiologic movement at C3-
4 and C4-5.

## 2012-04-29 IMAGING — CR DG CHEST 2V
2 series · 2 of 2 positions shown · non-contrast
Comparison: None

CLINICAL DATA: Preop open heart surgery

CHEST - 2 VIEW

[w chest pa]
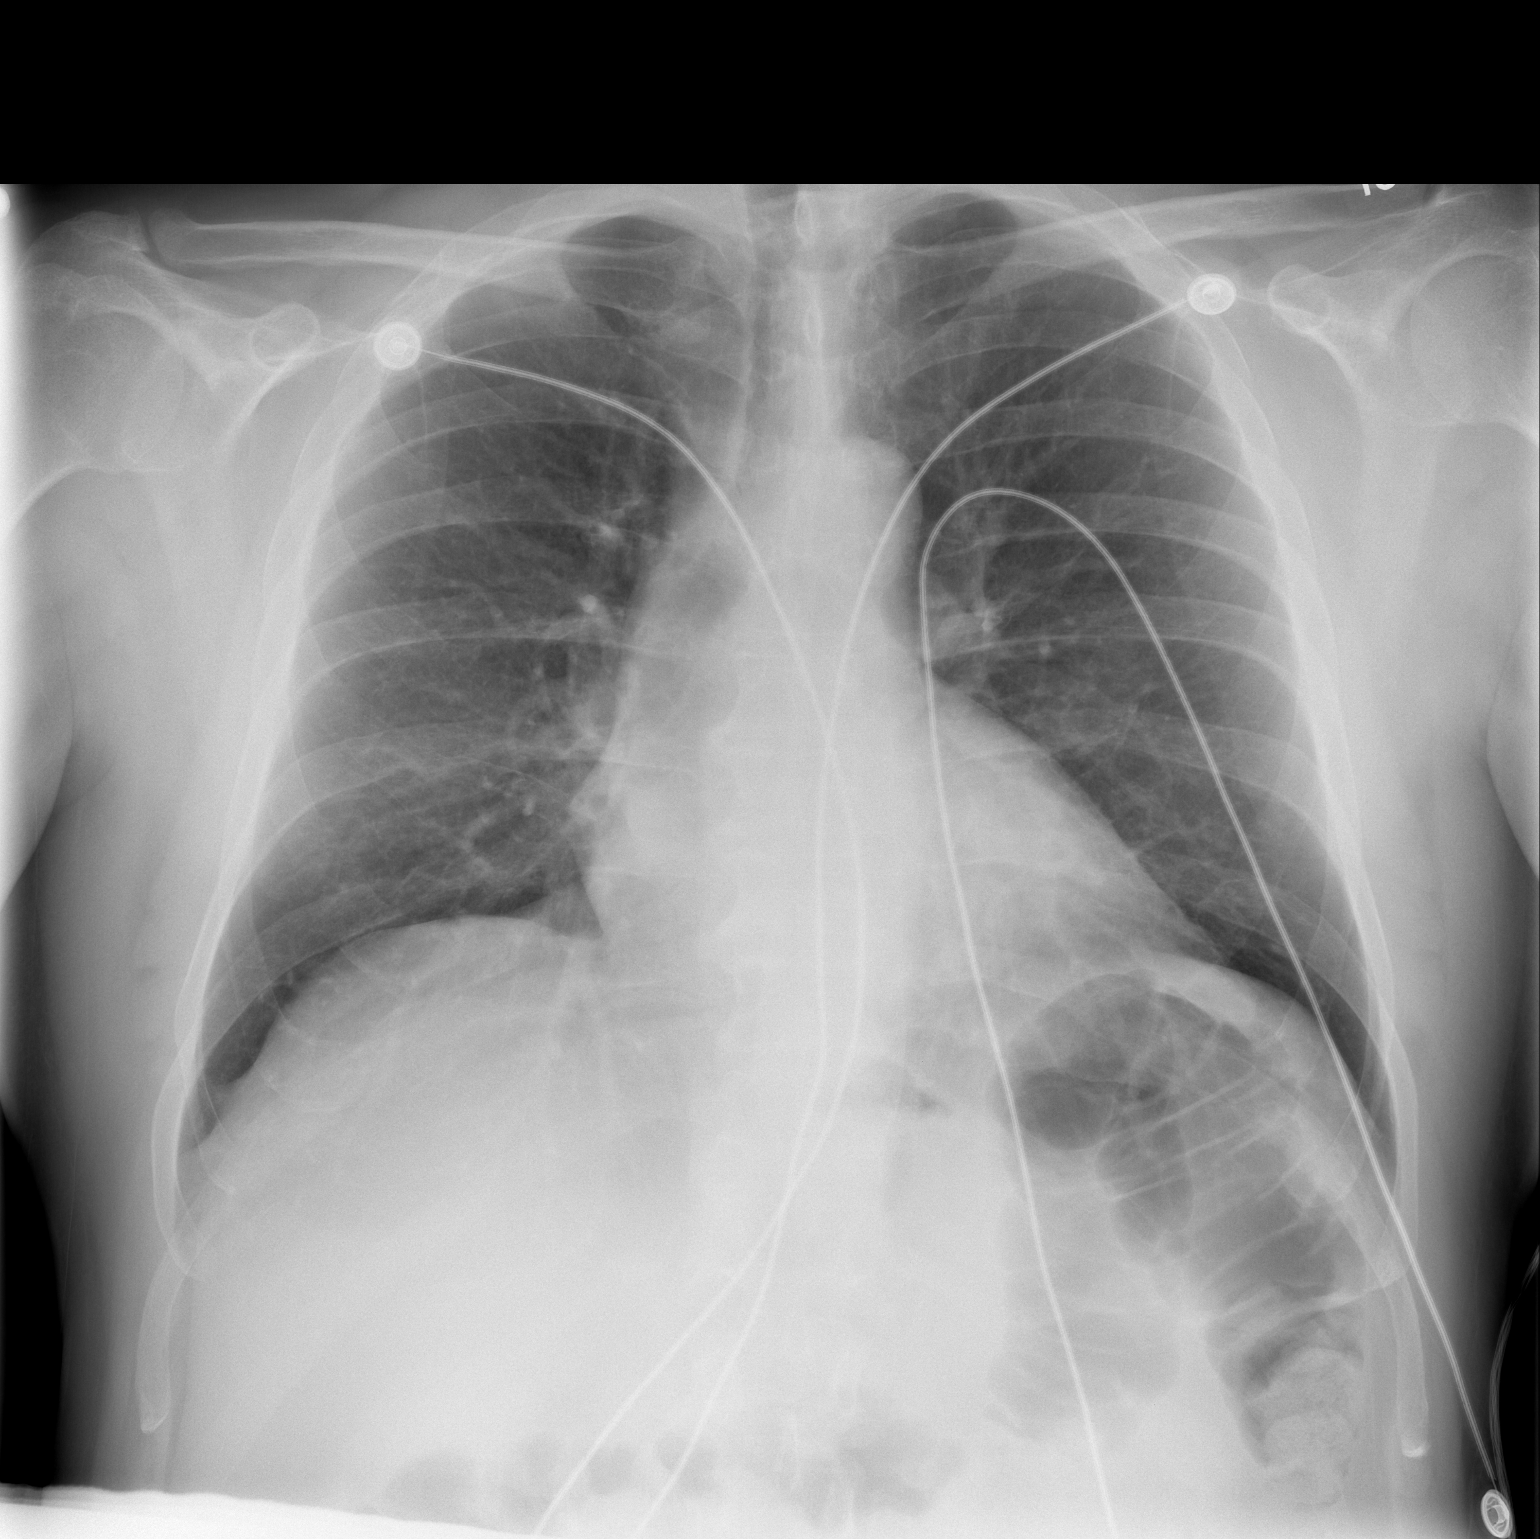

[w chest lat *]
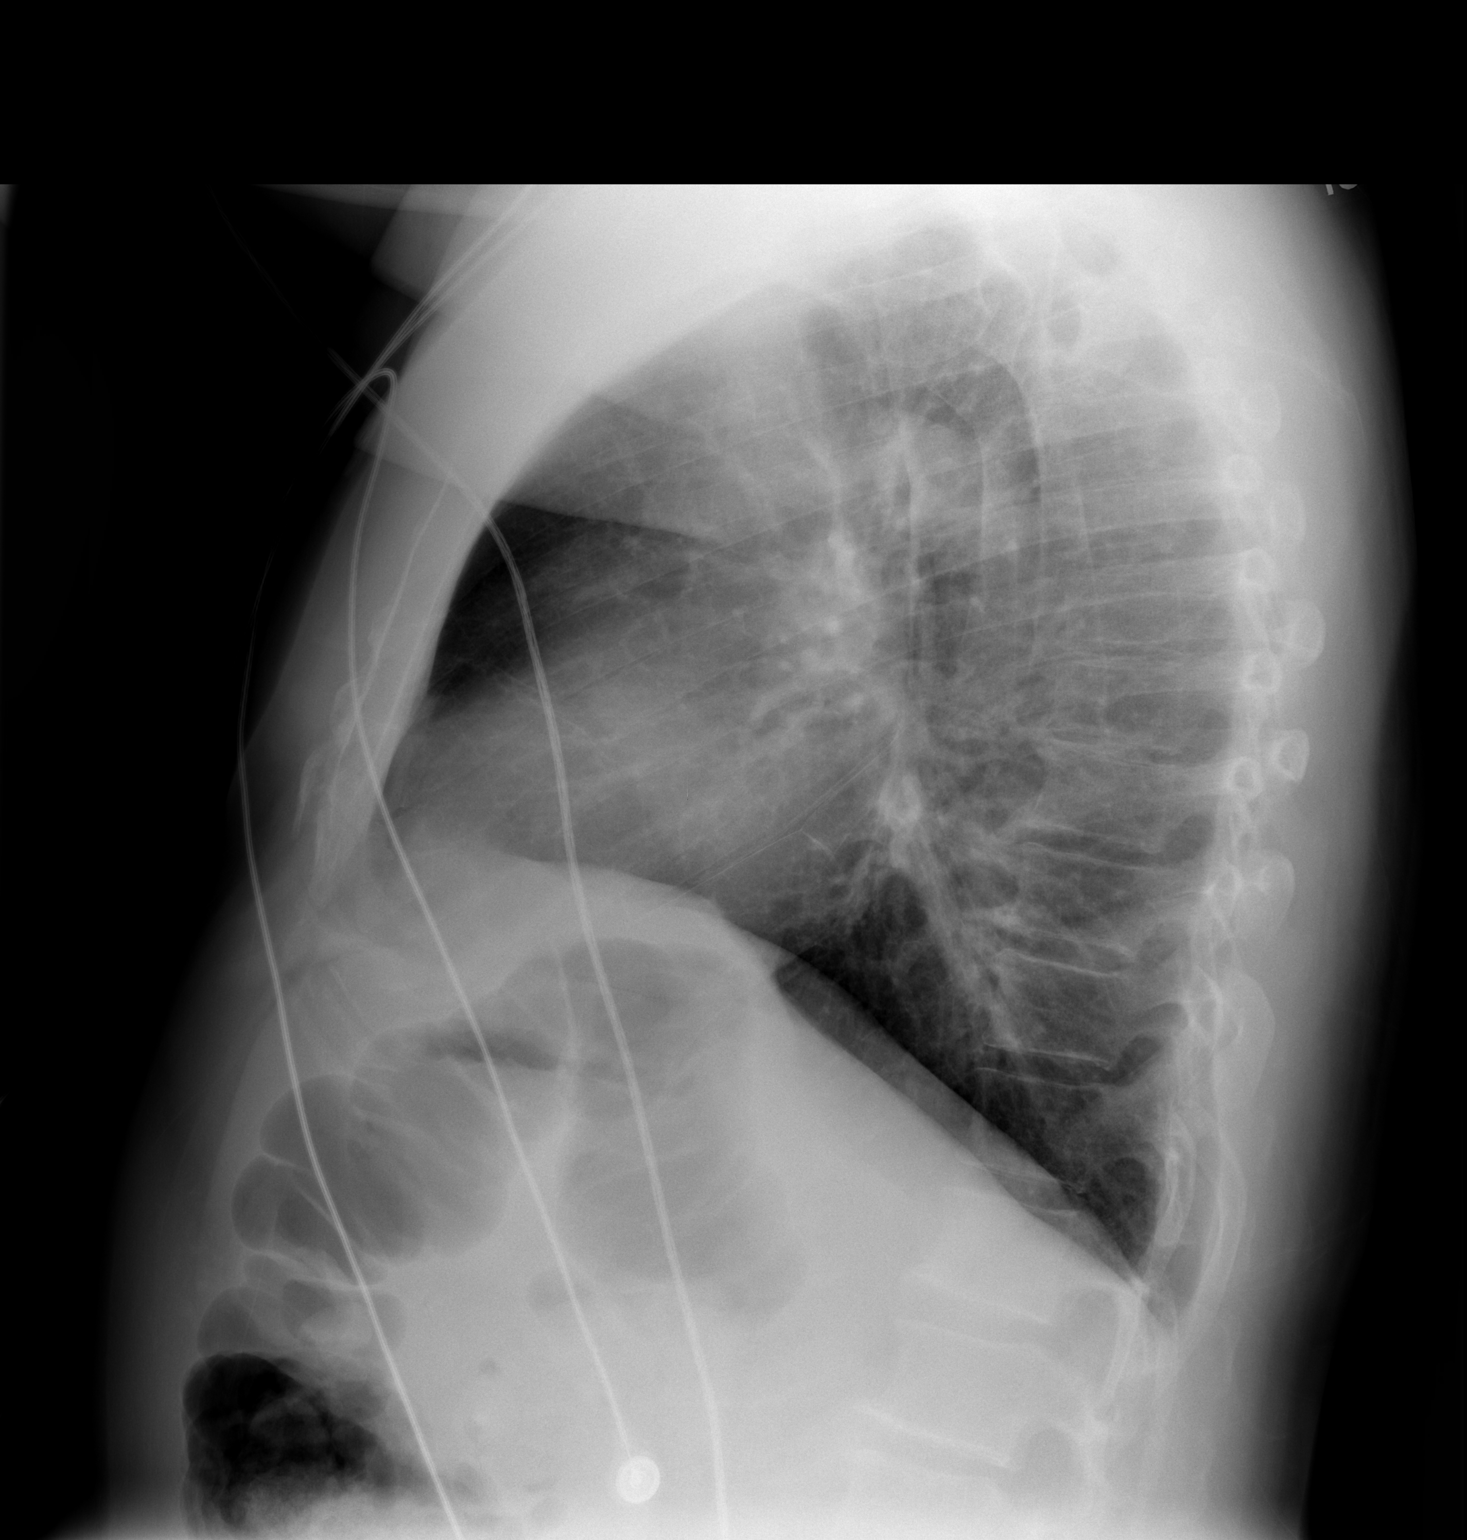

[2 of 2 positions shown; findings below may reference images not displayed]

FINDINGS: Heart size upper normal.  Negative for heart failure.
Streaky density left lower lobe compatible with atelectasis.
Negative for pneumonia or effusion.  Mild hyperinflation.
IMPRESSION: Left lower lobe atelectasis.  No acute cardiopulmonary disease

## 2012-04-30 IMAGING — CR DG CHEST 1V PORT
1 series · 1 of 1 positions shown · non-contrast
Comparison: 01/08/2011

CLINICAL DATA: Coronary artery disease.  Status post CABG.

PORTABLE CHEST - 1 VIEW

[view not recorded]
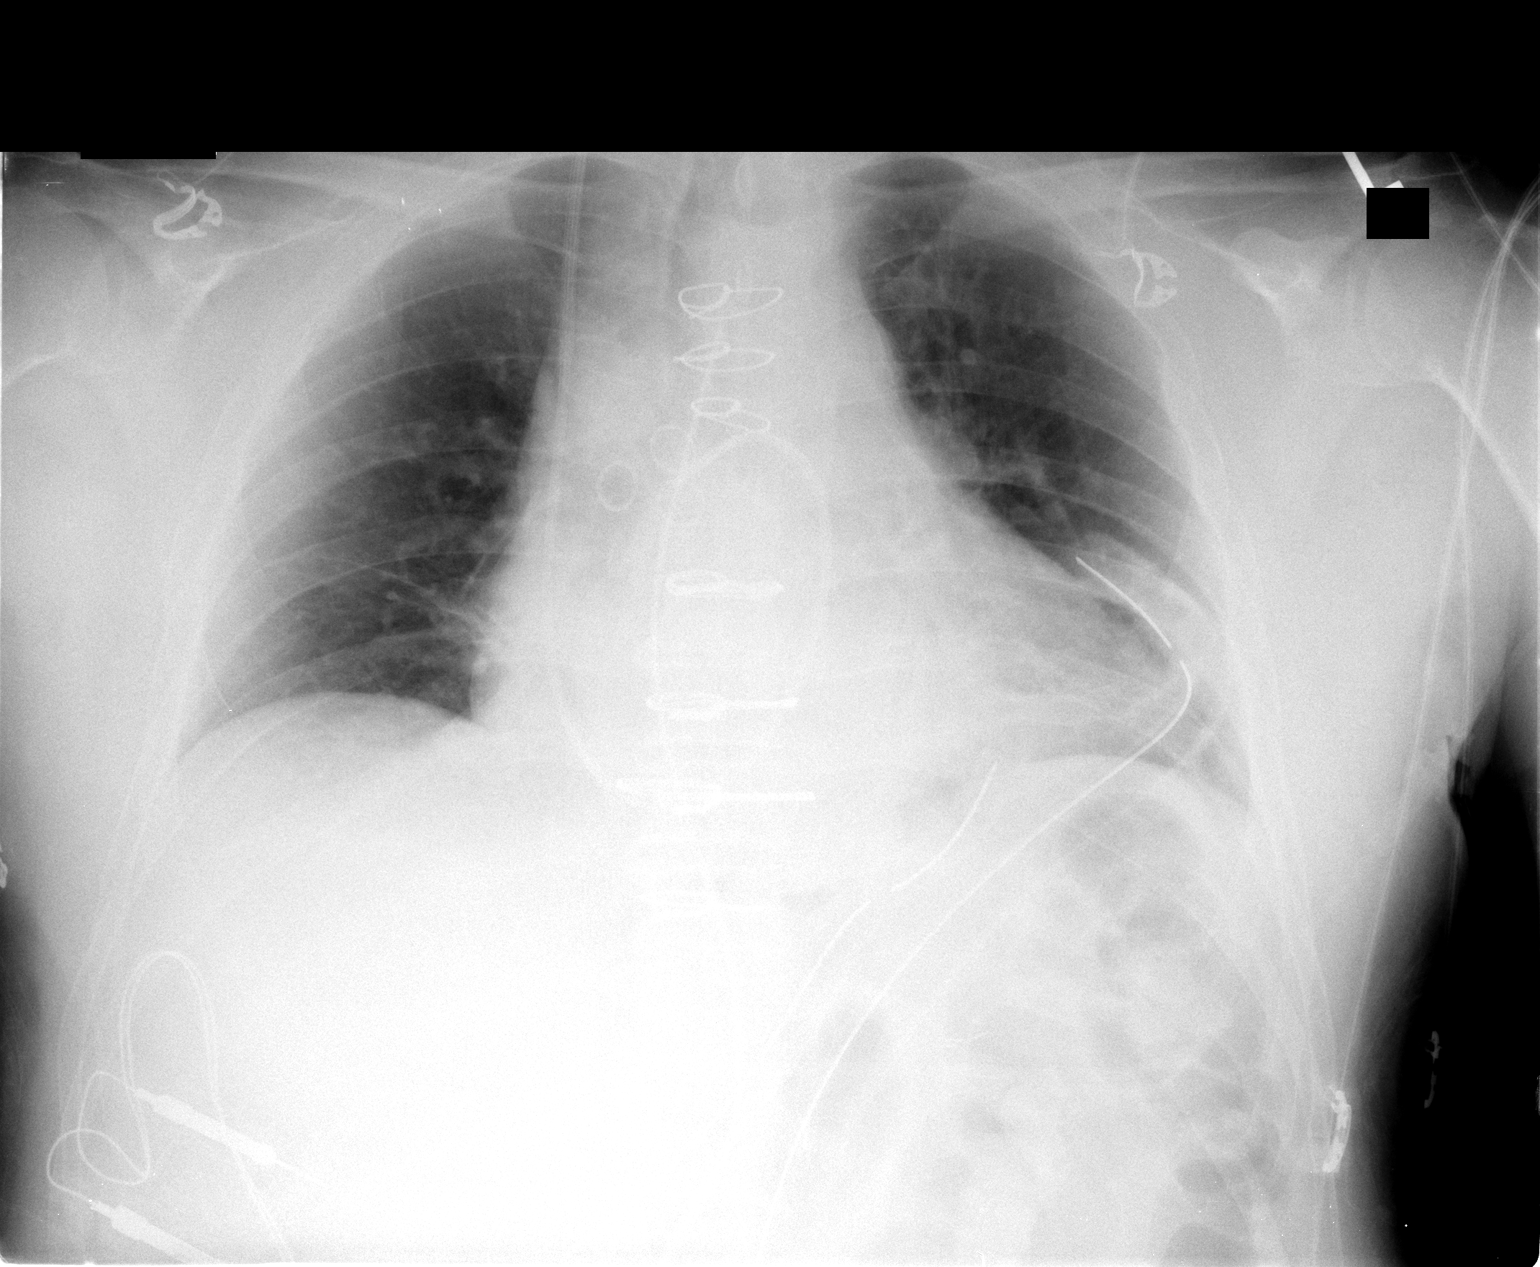

[1 of 1 positions shown; findings below may reference images not displayed]

FINDINGS: Chest tubes remain in place.  There is no pneumothorax.
Swan-Ganz catheter tip is in the right main pulmonary artery.  The
endotracheal tube and NG tube have been removed.

Aeration at the bases has improved.
IMPRESSION: Improved aeration bilaterally with no pneumothorax.

## 2012-05-01 IMAGING — CR DG CHEST 1V PORT
1 series · 1 of 1 positions shown · non-contrast
Comparison: Portable chest x-ray yesterday and [DATE] [DATE].

CLINICAL DATA: Postop CABG.  Chest tube and Swan-Ganz catheter
removal.

PORTABLE CHEST - 1 VIEW [DATE]/6276 2775 hours:

[AP]
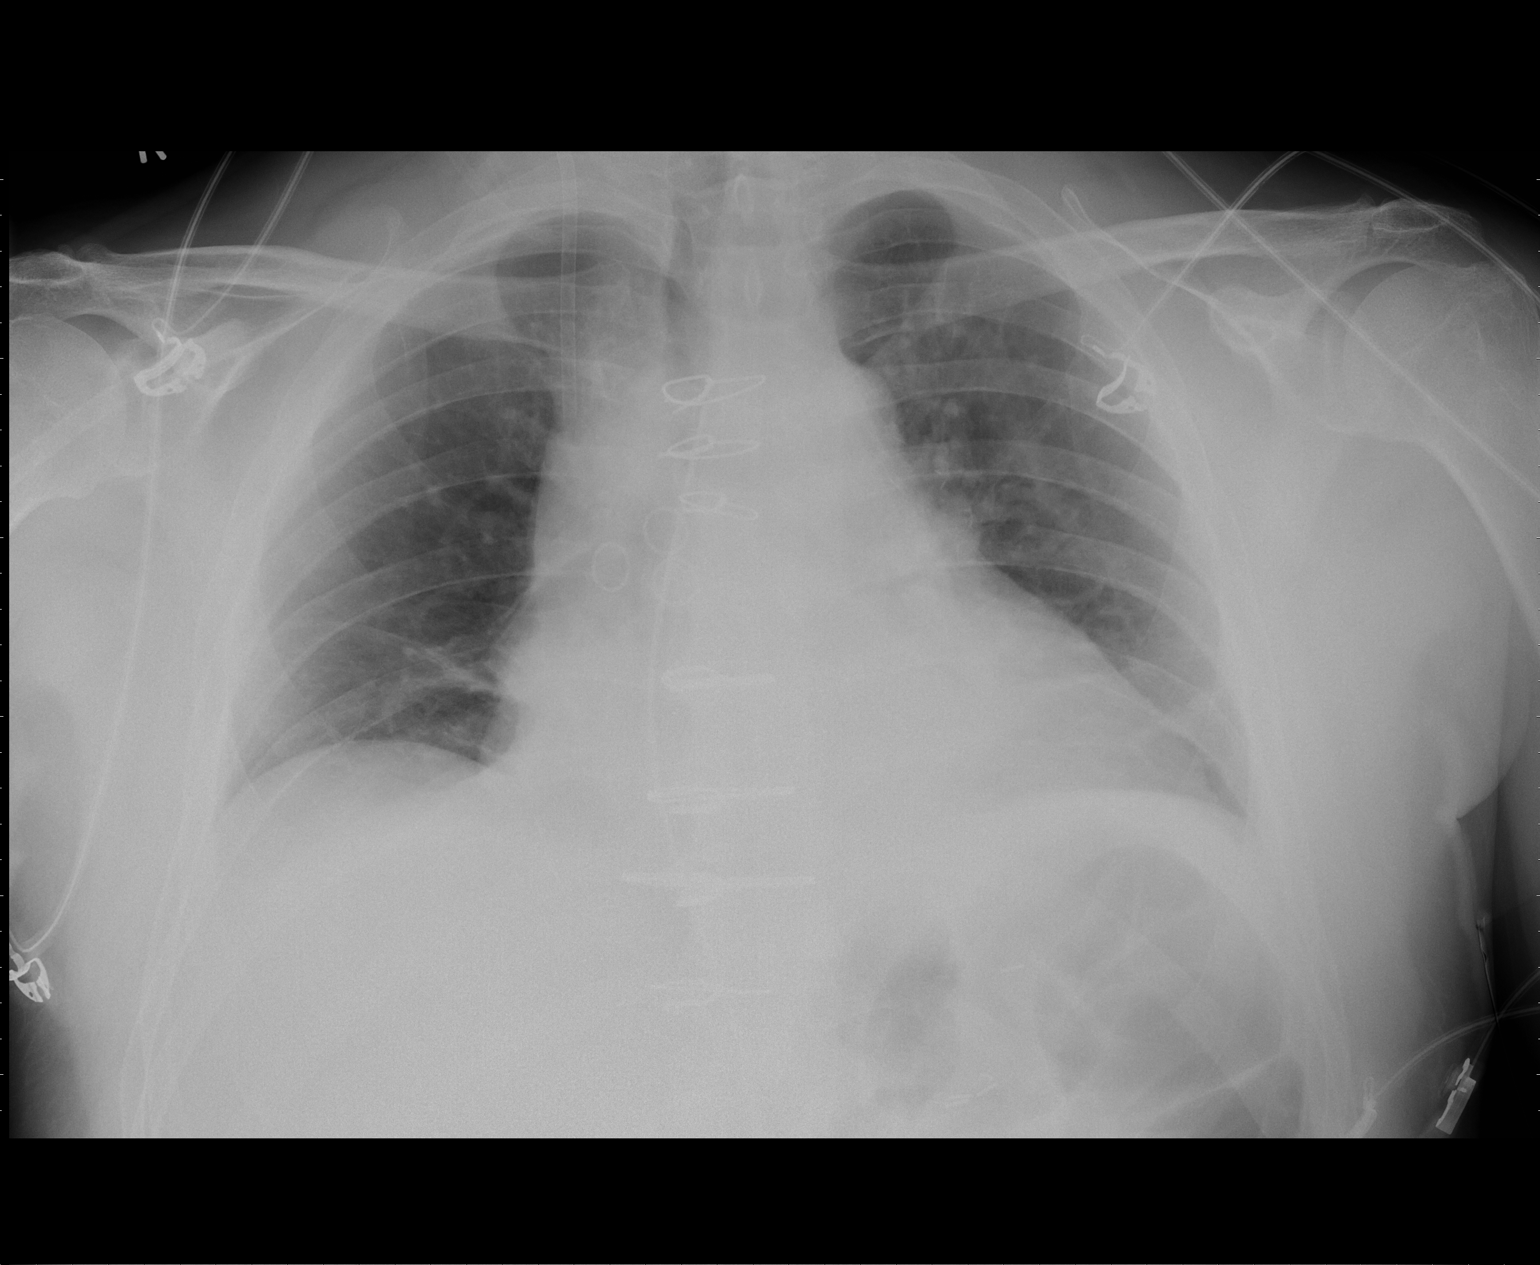

[1 of 1 positions shown; findings below may reference images not displayed]

FINDINGS: Sternotomy for CABG.  Interval left chest tube removal
with no pneumothorax.  Interval Swan-Ganz catheter removal.  Right
jugular introducer sheath tip remains in the upper SVC.  Suboptimal
inspiration with mild atelectasis in the lung bases, unchanged.
Probable small effusions, unchanged.  No new pulmonary parenchymal
abnormalities.  Cardiac silhouette mildly enlarged but stable.
Pulmonary vascularity normal.
IMPRESSION: No pneumothorax after left chest tube removal.  Mild bibasilar
atelectasis and small bilateral pleural effusions.  No acute
cardiopulmonary disease otherwise.

## 2012-06-28 ENCOUNTER — Emergency Department (HOSPITAL_COMMUNITY)
Admission: EM | Admit: 2012-06-28 | Discharge: 2012-06-28 | Disposition: A | Discharge status: Home / Self Care | Payer: PRIVATE HEALTH INSURANCE | Attending: Emergency Medicine | Admitting: Emergency Medicine

## 2012-06-28 ENCOUNTER — Encounter (HOSPITAL_COMMUNITY): Payer: Self-pay | Admitting: *Deleted

## 2012-06-28 DIAGNOSIS — Z951 Presence of aortocoronary bypass graft: Secondary | ICD-10-CM | POA: Insufficient documentation

## 2012-06-28 DIAGNOSIS — I2510 Atherosclerotic heart disease of native coronary artery without angina pectoris: Secondary | ICD-10-CM | POA: Insufficient documentation

## 2012-06-28 DIAGNOSIS — T5994XA Toxic effect of unspecified gases, fumes and vapors, undetermined, initial encounter: Secondary | ICD-10-CM

## 2012-06-28 DIAGNOSIS — Y939 Activity, unspecified: Secondary | ICD-10-CM | POA: Insufficient documentation

## 2012-06-28 DIAGNOSIS — T65893A Toxic effect of other specified substances, assault, initial encounter: Secondary | ICD-10-CM | POA: Insufficient documentation

## 2012-06-28 DIAGNOSIS — I10 Essential (primary) hypertension: Secondary | ICD-10-CM | POA: Insufficient documentation

## 2012-06-28 DIAGNOSIS — Y9269 Other specified industrial and construction area as the place of occurrence of the external cause: Secondary | ICD-10-CM | POA: Insufficient documentation

## 2012-06-28 DIAGNOSIS — T65891A Toxic effect of other specified substances, accidental (unintentional), initial encounter: Secondary | ICD-10-CM | POA: Insufficient documentation

## 2012-06-28 DIAGNOSIS — Z7982 Long term (current) use of aspirin: Secondary | ICD-10-CM | POA: Insufficient documentation

## 2012-06-28 DIAGNOSIS — Z79899 Other long term (current) drug therapy: Secondary | ICD-10-CM | POA: Insufficient documentation
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
## 2012-07-01 NOTE — ED Provider Notes (Signed)
Medical screening examination/treatment/procedure(s) were performed by non-physician practitioner and as supervising physician I was immediately available for consultation/collaboration.   Richardean Canal, MD 07/01/12 [phone]

## 2013-04-21 ENCOUNTER — Encounter: Payer: Self-pay | Admitting: Physician Assistant

## 2013-04-21 ENCOUNTER — Telehealth: Payer: Self-pay | Admitting: Cardiovascular Disease

## 2013-04-21 DIAGNOSIS — E782 Mixed hyperlipidemia: Secondary | ICD-10-CM

## 2013-04-21 DIAGNOSIS — Z951 Presence of aortocoronary bypass graft: Secondary | ICD-10-CM | POA: Insufficient documentation

## 2013-04-21 DIAGNOSIS — I2510 Atherosclerotic heart disease of native coronary artery without angina pectoris: Secondary | ICD-10-CM

## 2013-04-21 DIAGNOSIS — Z79899 Other long term (current) drug therapy: Secondary | ICD-10-CM

## 2013-04-21 DIAGNOSIS — I10 Essential (primary) hypertension: Secondary | ICD-10-CM | POA: Insufficient documentation

## 2013-04-21 NOTE — Telephone Encounter (Signed)
Returned call.  Pt requesting Crestor be changed to something that comes in generic.  Pt informed it has been > 1 year since his last visit, April 2013.  Informed refill requests received and can only give 30-day supplies r/t no visit or labs in > 1 year.  Pt verbalized understanding.  Stated he has financial problems and unable to make appt now r/t no copay.  Judie Grieve, PA-C notified and advised 30-day refills and change Crestor to atorvastatin 20 mg for 30-day only.  Also advised CMP and Lipid Profile.  Pt informed and verbalized understanding.  Pt scheduled appt for next week w/ Judie Grieve, PA-C and stated he has enough pills to last until appt as pt prefers to get 90-day supplies.  Pt will go to lab tomorrow morning at work at [employer] to have labs drawn.    Refills deferred to appt on 9.2.14 at 11am.  Pt agreed w/ plan.

## 2013-04-21 NOTE — Telephone Encounter (Signed)
Please call-wants to change his Crestor medicine-wants to change to a generic-something that is less expensive.If not at home call him on his cell [phone]

## 2013-04-22 ENCOUNTER — Encounter: Payer: Self-pay | Admitting: Cardiovascular Disease

## 2013-04-22 LAB — COMPREHENSIVE METABOLIC PANEL
BUN: 15 mg/dL (ref 6–23)
CO2: 28 mEq/L (ref 19–32)
Glucose, Bld: 95 mg/dL (ref 70–99)
Sodium: 142 mEq/L (ref 135–145)
Total Bilirubin: 0.6 mg/dL (ref 0.3–1.2)
Total Protein: 6.8 g/dL (ref 6.0–8.3)

## 2013-04-22 LAB — LIPID PANEL
Cholesterol: 126 mg/dL (ref 0–200)
HDL: 45 mg/dL (ref >39)
Triglycerides: 96 mg/dL (ref <150)
VLDL: 19 mg/dL (ref 0–40)

## 2013-04-26 ENCOUNTER — Telehealth: Payer: Self-pay | Admitting: Cardiovascular Disease

## 2013-04-26 ENCOUNTER — Encounter: Payer: Self-pay | Admitting: Physician Assistant

## 2013-04-26 ENCOUNTER — Ambulatory Visit (INDEPENDENT_AMBULATORY_CARE_PROVIDER_SITE_OTHER): Payer: 59 | Admitting: Physician Assistant

## 2013-04-26 VITALS — BP 130/92 | HR 64 | Ht 65.0 in | Wt 201.0 lb

## 2013-04-26 DIAGNOSIS — E66811 Obesity, class 1: Secondary | ICD-10-CM

## 2013-04-26 DIAGNOSIS — I2510 Atherosclerotic heart disease of native coronary artery without angina pectoris: Secondary | ICD-10-CM

## 2013-04-26 DIAGNOSIS — E669 Obesity, unspecified: Secondary | ICD-10-CM | POA: Insufficient documentation

## 2013-04-26 DIAGNOSIS — E785 Hyperlipidemia, unspecified: Secondary | ICD-10-CM

## 2013-04-26 MED ORDER — LOSARTAN POTASSIUM 25 MG PO TABS: 25.0000 mg | ORAL_TABLET | Freq: Every day | ORAL | Status: DC

## 2013-04-26 MED ORDER — METOPROLOL TARTRATE 25 MG PO TABS: 12.5000 mg | ORAL_TABLET | Freq: Three times a day (TID) | ORAL | Status: DC

## 2013-04-26 MED ORDER — ATORVASTATIN CALCIUM 20 MG PO TABS: 20.0000 mg | ORAL_TABLET | Freq: Every day | ORAL | Status: DC

## 2013-04-26 NOTE — Telephone Encounter (Signed)
Calling to check on Derek Hayden Rx refill that was faxed over last Thursday

## 2013-04-26 NOTE — Assessment & Plan Note (Signed)
His last lipid panel was 04/21/2013:  Total cholesterol 126, triglycerides 96, HDL 45, LDL 62, VLDL 19. He has requested to switch from Crestor to atorvastatin due to cost. I'll make this change for him.  He was also started on Zetia by Dr. Felipa Eth.

## 2013-04-26 NOTE — Patient Instructions (Signed)
Follow up in one year with Dr. Allyson Sabal.

## 2013-04-26 NOTE — Telephone Encounter (Signed)
Rx was sent to pharmacy electronically. 

## 2013-04-26 NOTE — Assessment & Plan Note (Addendum)
Patient appears to be doing quite well from a cardiovascular standpoint. His blood pressure is controlled is having no symptoms of angina.  He does report some shortness of breath the last couple weeks however, it seems more related to anxiety.  He does not occur when he is exercising.

## 2013-04-26 NOTE — Progress Notes (Signed)
Date:  04/26/2013   ID:  Derek Hayden, DOB Jul 21, 1951, MRN [id number]  PCP:  Hoyle Sauer, MD  Primary Cardiologist:  Allyson Sabal     History of Present Illness: Derek Hayden is a 62 y.o.  mildly overweight, divorced, Caucasian male father of 1, grandfather to 2 grandchildren who Dr. Allyson Sabal last saw on 12/10/11. 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 He has a history of CAD status post cath Jan 07, 2011 revealing left main 2-vessel disease with mild LV dysfunction. He ultimately underwent coronary artery bypass grafting x4 by Dr. Kathlee Nations Trigt with a LIMA to his LAD, a vein to a diagonal branch, OM and PDA. His hospital course was uncomplicated, and he recuperated nicely.  He had an echo performed April 03, 2011 that was normal with some mild septal hypokinesia and a Myoview that was normal as well. Recent lab work revealed total cholesterol of 146, LDL of 90, HDL 41.   Presents today for his six-month followup.  He currently completes a fairly rigorous exercise routine 7 days a week.  He has noticed some shortness of breath the last 2-3 weeks but it typically occurs when he is in stressful situations and not when he is doing his exercise.   He currently denies nausea, vomiting, fever, chest pain,  orthopnea, dizziness, PND, cough, congestion, abdominal pain, hematochezia, melena, lower extremity edema, claudication.  Wt Readings from Last 3 Encounters:  04/26/13 201 lb (91.173 kg)  04/17/11 185 lb (83.915 kg)  04/07/11 188 lb 8 oz (85.503 kg)     Past Medical History  Diagnosis Date  . CAD (coronary artery disease)     had 3 vessels 100% blocked, 1 vessel 90%- had CABG in May  . Hypertension     Current Outpatient Prescriptions  Medication Sig Dispense Refill  . aspirin 162 MG EC tablet Take 162 mg by mouth daily.        Marland Kitchen ezetimibe (ZETIA) 10 MG tablet Take 10 mg by mouth daily.      Marland Kitchen losartan (COZAAR) 25 MG tablet Take 25 mg by mouth  daily.        . metoprolol tartrate (LOPRESSOR) 25 MG tablet Take 12.5 mg by mouth 3 (three) times daily.      . Multiple Vitamin (MULTIVITAMIN PO) Take 1 tablet by mouth daily.        . rosuvastatin (CRESTOR) 20 MG tablet Take 20 mg by mouth daily.        Marland Kitchen atorvastatin (LIPITOR) 20 MG tablet Take 1 tablet (20 mg total) by mouth daily.  90 tablet  3   No current facility-administered medications for this visit.    Allergies:   No Known Allergies  Social History:  The patient  reports that he has never smoked. He has never used smokeless tobacco. He reports that he does not drink alcohol or use illicit drugs.   Family history:   Family History  Problem Relation Age of Onset  . Pancreatic cancer Father   . Colon cancer Neg Hx     ROS:  Please see the history of present illness.  All other systems reviewed and negative.   PHYSICAL EXAM: VS:  BP 130/92  Pulse 64  Ht 5\' 5"  (1.651 m)  Wt 201 lb (91.173 kg)  BMI 33.45 kg/m2 Well nourished, well developed, in no acute distress HEENT: Pupils are equal round react to light accommodation extraocular movements are intact.  Neck: no JVDNo  cervical lymphadenopathy. Cardiac: Regular rate and rhythm without murmurs rubs or gallops. Lungs:  clear to auscultation bilaterally, no wheezing, rhonchi or rales Abd: soft, nontender, positive bowel sounds all quadrants, no hepatosplenomegaly Ext: no lower extremity edema.  2+ radial and dorsalis pedis pulses. Skin: warm and dry Neuro:  Grossly normal  EKG:   Normal sinus rhythm no changes from prior rate 64 beats per minute  ASSESSMENT AND PLAN:  Problem List Items Addressed This Visit   RESOLVED: Obesity (BMI 30.0-34.9)   Hyperlipidemia     His last lipid panel was 04/21/2013:  Total cholesterol 126, triglycerides 96, HDL 45, LDL 62, VLDL 19. He has requested to switch from Crestor to atorvastatin due to cost. I'll make this change for him.  He was also started on Zetia by Dr. Felipa Eth.     Relevant Medications      ezetimibe (ZETIA) 10 MG tablet      atorvastatin (LIPITOR) tablet   CAD (coronary artery disease):  - Primary     Patient appears to be doing quite well from a cardiovascular standpoint. His blood pressure is controlled is having no symptoms of angina.  He does report some shortness of breath the last couple weeks however, it seems more related to anxiety.  He does not occur when he is exercising.    Relevant Medications      ezetimibe (ZETIA) 10 MG tablet      atorvastatin (LIPITOR) tablet   Other Relevant Orders      EKG 12-Lead

## 2013-06-20 ENCOUNTER — Emergency Department (HOSPITAL_COMMUNITY): Payer: PRIVATE HEALTH INSURANCE

## 2013-06-20 ENCOUNTER — Emergency Department (HOSPITAL_COMMUNITY)
Admission: EM | Admit: 2013-06-20 | Discharge: 2013-06-20 | Disposition: A | Discharge status: Home / Self Care | Payer: PRIVATE HEALTH INSURANCE | Attending: Emergency Medicine | Admitting: Emergency Medicine

## 2013-06-20 ENCOUNTER — Encounter (HOSPITAL_COMMUNITY): Payer: Self-pay | Admitting: Emergency Medicine

## 2013-06-20 DIAGNOSIS — S46909A Unspecified injury of unspecified muscle, fascia and tendon at shoulder and upper arm level, unspecified arm, initial encounter: Secondary | ICD-10-CM | POA: Insufficient documentation

## 2013-06-20 DIAGNOSIS — S4980XA Other specified injuries of shoulder and upper arm, unspecified arm, initial encounter: Secondary | ICD-10-CM | POA: Insufficient documentation

## 2013-06-20 DIAGNOSIS — M25512 Pain in left shoulder: Secondary | ICD-10-CM

## 2013-06-20 DIAGNOSIS — Z79899 Other long term (current) drug therapy: Secondary | ICD-10-CM | POA: Insufficient documentation

## 2013-06-20 DIAGNOSIS — Y9269 Other specified industrial and construction area as the place of occurrence of the external cause: Secondary | ICD-10-CM | POA: Insufficient documentation

## 2013-06-20 DIAGNOSIS — Y990 Civilian activity done for income or pay: Secondary | ICD-10-CM | POA: Insufficient documentation

## 2013-06-20 DIAGNOSIS — I2510 Atherosclerotic heart disease of native coronary artery without angina pectoris: Secondary | ICD-10-CM | POA: Insufficient documentation

## 2013-06-20 DIAGNOSIS — Z951 Presence of aortocoronary bypass graft: Secondary | ICD-10-CM | POA: Insufficient documentation

## 2013-06-20 DIAGNOSIS — Z7982 Long term (current) use of aspirin: Secondary | ICD-10-CM | POA: Insufficient documentation

## 2013-06-20 DIAGNOSIS — Y9389 Activity, other specified: Secondary | ICD-10-CM | POA: Insufficient documentation

## 2013-06-20 DIAGNOSIS — I10 Essential (primary) hypertension: Secondary | ICD-10-CM | POA: Insufficient documentation

## 2013-06-20 DIAGNOSIS — IMO0002 Reserved for concepts with insufficient information to code with codable children: Secondary | ICD-10-CM | POA: Insufficient documentation

## 2013-06-20 MED ORDER — HYDROCODONE-ACETAMINOPHEN 5-325 MG PO TABS: 1.0000 | ORAL_TABLET | Freq: Four times a day (QID) | ORAL | Status: DC | PRN

## 2013-06-20 MED ORDER — NAPROXEN 500 MG PO TABS: 500.0000 mg | ORAL_TABLET | Freq: Two times a day (BID) | ORAL | Status: DC

## 2013-06-20 NOTE — ED Notes (Signed)
Pt reports being injured at work on Friday, pt is Engineer, materials here, reports pain in left shoulder, reports limited ROM.
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
## 2013-06-22 NOTE — ED Provider Notes (Signed)
Medical screening examination/treatment/procedure(s) were performed by non-physician practitioner and as supervising physician I was immediately available for consultation/collaboration.  EKG Interpretation   None         Kelsye Loomer S Isami Mehra, MD 06/22/13 0947 

## 2014-03-01 DIAGNOSIS — C4491 Basal cell carcinoma of skin, unspecified: Secondary | ICD-10-CM

## 2014-03-01 HISTORY — DX: Basal cell carcinoma of skin, unspecified: C44.91

## 2014-04-21 ENCOUNTER — Other Ambulatory Visit: Payer: Self-pay | Admitting: Physician Assistant

## 2014-04-21 ENCOUNTER — Other Ambulatory Visit: Payer: Self-pay | Admitting: Cardiovascular Disease

## 2014-04-21 ENCOUNTER — Other Ambulatory Visit: Payer: Self-pay | Admitting: *Deleted

## 2014-04-21 MED ORDER — METOPROLOL TARTRATE 25 MG PO TABS: 12.5000 mg | ORAL_TABLET | Freq: Three times a day (TID) | ORAL | Status: DC

## 2014-04-21 MED ORDER — LOSARTAN POTASSIUM 25 MG PO TABS: 25.0000 mg | ORAL_TABLET | Freq: Every day | ORAL | Status: DC

## 2014-04-21 NOTE — Telephone Encounter (Signed)
Rx was sent to pharmacy electronically. 

## 2014-04-21 NOTE — Telephone Encounter (Signed)
Rx refill request for 90 day supply received. Rx was sent to patient pharmacy

## 2014-05-02 ENCOUNTER — Ambulatory Visit (INDEPENDENT_AMBULATORY_CARE_PROVIDER_SITE_OTHER): Payer: Managed Care, Other (non HMO) | Admitting: Cardiovascular Disease

## 2014-05-02 ENCOUNTER — Encounter: Payer: Self-pay | Admitting: Cardiovascular Disease

## 2014-05-02 VITALS — BP 130/82 | HR 77 | Ht 65.0 in | Wt 200.0 lb

## 2014-05-02 DIAGNOSIS — Z951 Presence of aortocoronary bypass graft: Secondary | ICD-10-CM

## 2014-05-02 DIAGNOSIS — I2510 Atherosclerotic heart disease of native coronary artery without angina pectoris: Secondary | ICD-10-CM

## 2014-05-02 DIAGNOSIS — Z79899 Other long term (current) drug therapy: Secondary | ICD-10-CM

## 2014-05-02 DIAGNOSIS — E782 Mixed hyperlipidemia: Secondary | ICD-10-CM

## 2014-05-02 DIAGNOSIS — I10 Essential (primary) hypertension: Secondary | ICD-10-CM

## 2014-05-02 DIAGNOSIS — E785 Hyperlipidemia, unspecified: Secondary | ICD-10-CM

## 2014-05-02 NOTE — Assessment & Plan Note (Signed)
On statin therapy. We will recheck a lipid and liver profile 

## 2014-05-02 NOTE — Assessment & Plan Note (Signed)
Status post coronary artery bypass grafting 01/08/2011 after cardiac catheterization revealed left main two-vessel disease with mild LV dysfunction. He had coronary artery bypass grafting x4 by Dr. Tharon Aquas Trigt with a LIMA to his LAD, vein to diagonal branch, obtuse marginal branch and PDA. Followup echo revealed normal LV function with septal hypokinesia and a Myoview stress test was normal as well. He denies chest pain or shortness of breath.

## 2014-05-02 NOTE — Assessment & Plan Note (Signed)
Controlled on current medications 

## 2014-05-02 NOTE — Progress Notes (Signed)
05/02/2014 Arlyss Queen   November 13, 1950  [id number]  Primary Physician Tivis Ringer, MD Primary Cardiologist: Lorretta Harp MD Renae Gloss   HPI:  Derek Hayden is a 62 y.o. mildly overweight, divorced, Caucasian male father of 77, grandfather to  2 grandchildren who Dr. Gwenlyn Found last saw on 12/10/11. 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 He has a history of CAD status post cath Jan 07, 2011 revealing left main 2-vessel  disease with mild LV dysfunction. He ultimately underwent coronary artery bypass grafting x4 by Dr. Tharon Aquas Trigt with a LIMA to his LAD, a vein to a diagonal branch, OM and PDA. His hospital course was  uncomplicated, and he recuperated nicely. He had an echo performed April 03, 2011 that was normal with some mild septal hypokinesia and a Myoview that was normal as well.  I saw him in the office 2 years ago and he was seen one year ago by Tenny Craw [facility]. Since that time he's denied chest pain or shortness of breath.   Current Outpatient Prescriptions  Medication Sig Dispense Refill  . aspirin 162 MG EC tablet Take 162 mg by mouth daily.        Marland Kitchen atorvastatin (LIPITOR) 20 MG tablet TAKE 1 TABLET BY MOUTH ONCE DAILY  90 tablet  0  . ezetimibe (ZETIA) 10 MG tablet Take 10 mg by mouth daily.      Marland Kitchen HYDROcodone-acetaminophen (NORCO/VICODIN) 5-325 MG per tablet Take 1-2 tablets by mouth every 6 (six) hours as needed for pain.  20 tablet  0  . losartan (COZAAR) 25 MG tablet Take 1 tablet (25 mg total) by mouth daily.  90 tablet  0  . metoprolol tartrate (LOPRESSOR) 25 MG tablet Take 0.5 tablets (12.5 mg total) by mouth 3 (three) times daily.  135 tablet  0  . Multiple Vitamin (MULTIVITAMIN PO) Take 1 tablet by mouth daily.        . naproxen (NAPROSYN) 500 MG tablet Take 1 tablet (500 mg total) by mouth 2 (two) times daily.  30 tablet  0   No current facility-administered medications for this visit.    No Known  Allergies  History   Social History  . Marital Status: Single    Spouse Name: N/A    Number of Children: N/A  . Years of Education: N/A   Occupational History  . Not on file.   Social History Main Topics  . Smoking status: Never Smoker   . Smokeless tobacco: Never Used  . Alcohol Use: No  . Drug Use: No  . Sexual Activity: Not on file   Other Topics Concern  . Not on file   Social History Narrative  . No narrative on file     Review of Systems: General: negative for chills, fever, night sweats or weight changes.  Cardiovascular: negative for chest pain, dyspnea on exertion, edema, orthopnea, palpitations, paroxysmal nocturnal dyspnea or shortness of breath Dermatological: negative for rash Respiratory: negative for cough or wheezing Urologic: negative for hematuria Abdominal: negative for nausea, vomiting, diarrhea, bright red blood per rectum, melena, or hematemesis Neurologic: negative for visual changes, syncope, or dizziness All other systems reviewed and are otherwise negative except as noted above.    Blood pressure 130/82, pulse 77, height 5\' 5"  (1.651 m), weight 200 lb (90.719 kg).  General appearance: alert and no distress Neck: no adenopathy, no carotid bruit, no JVD, supple, symmetrical, trachea midline and thyroid  not enlarged, symmetric, no tenderness/mass/nodules Lungs: clear to auscultation bilaterally Heart: regular rate and rhythm, S1, S2 normal, no murmur, click, rub or gallop Extremities: extremities normal, atraumatic, no cyanosis or edema  EKG Normal sinus rhythm at 77 without ST or T wave changes  ASSESSMENT AND PLAN:   S/P CABG x 4: Jan 08, 2011,  Dr. Prescott Gum, LIMA to the LAD, SVG to diag, SVG to OM, SVG to PDA.   Status post coronary artery bypass grafting 01/08/2011 after cardiac catheterization revealed left main two-vessel disease with mild LV dysfunction. He had coronary artery bypass grafting x4 by Dr. Tharon Aquas Trigt with a LIMA to  his LAD, vein to diagonal branch, obtuse marginal branch and PDA. Followup echo revealed normal LV function with septal hypokinesia and a Myoview stress test was normal as well. He denies chest pain or shortness of breath.  HTN (hypertension) Controlled on current medications  Hyperlipidemia On statin therapy. We will recheck a lipid and liver profile      Lorretta Harp MD [facility], [facility] 05/02/2014 11:50 AM

## 2014-05-02 NOTE — Patient Instructions (Signed)
Dr Gwenlyn Found has ordered you to have blood work to be done FASTING.  Dr Gwenlyn Found wants you to follow-up in 1 year. You will receive a reminder letter in the mail one to two months in advance. If you don't receive a letter, please call our office to schedule the follow-up appointment.

## 2014-05-05 ENCOUNTER — Telehealth: Payer: Self-pay | Admitting: Cardiovascular Disease

## 2014-05-05 LAB — LIPID PANEL
CHOL/HDL RATIO: 3.1 ratio
Cholesterol: 123 mg/dL (ref 0–200)
HDL: 40 mg/dL (ref >39)
LDL CALC: 69 mg/dL (ref 0–99)
Triglycerides: 71 mg/dL (ref <150)
VLDL: 14 mg/dL (ref 0–40)

## 2014-05-05 LAB — HEPATIC FUNCTION PANEL
ALBUMIN: 4 g/dL (ref 3.5–5.2)
ALK PHOS: 79 U/L (ref 39–117)
ALT: 26 U/L (ref 0–53)
AST: 23 U/L (ref 0–37)
BILIRUBIN INDIRECT: 0.5 mg/dL (ref 0.2–1.2)
BILIRUBIN TOTAL: 0.6 mg/dL (ref 0.2–1.2)
Bilirubin, Direct: 0.1 mg/dL (ref 0.0–0.3)
Total Protein: 6.3 g/dL (ref 6.0–8.3)

## 2014-05-05 MED ORDER — LOSARTAN POTASSIUM 25 MG PO TABS
25.0000 mg | ORAL_TABLET | Freq: Every day | ORAL | Status: DC
Start: 2014-05-05 — End: 2015-02-09

## 2014-05-05 MED ORDER — METOPROLOL TARTRATE 25 MG PO TABS: 12.5000 mg | ORAL_TABLET | Freq: Three times a day (TID) | ORAL | Status: DC

## 2014-05-05 NOTE — Telephone Encounter (Signed)
Rx refills were sent in for 90 day supplies. Spoke with patient and let him know they had been sent in. Patient voiced understanding

## 2014-05-05 NOTE — Telephone Encounter (Signed)
Pt need to get his Metoprolol 25 mg and Losartan 25 mg for that to be changed to #90 instead of #30.He will not need this until the end of the month.rt his

## 2014-05-08 ENCOUNTER — Encounter: Payer: Self-pay | Admitting: *Deleted

## 2014-07-24 ENCOUNTER — Other Ambulatory Visit: Payer: Self-pay | Admitting: Cardiovascular Disease

## 2014-07-24 ENCOUNTER — Other Ambulatory Visit: Payer: Self-pay | Admitting: *Deleted

## 2014-07-24 MED ORDER — ATORVASTATIN CALCIUM 20 MG PO TABS: 20.0000 mg | ORAL_TABLET | Freq: Every day | ORAL | Status: DC

## 2014-07-24 NOTE — Telephone Encounter (Signed)
Rx was sent to pharmacy electronically. 

## 2014-07-25 ENCOUNTER — Telehealth: Payer: Self-pay | Admitting: *Deleted

## 2014-07-25 ENCOUNTER — Other Ambulatory Visit: Payer: Self-pay | Admitting: Dermatology

## 2014-07-25 NOTE — Telephone Encounter (Signed)
Dr. Doran Durand is requesting medical clearance for this patient to have toe surgery.

## 2014-07-25 NOTE — Telephone Encounter (Signed)
Cleared first toe surgery at low risk

## 2014-07-25 NOTE — Telephone Encounter (Signed)
Form filled out and signed by Dr. Gwenlyn Found. Will fax to [facility].

## 2014-09-18 ENCOUNTER — Other Ambulatory Visit: Payer: Self-pay | Admitting: Dermatology

## 2014-12-29 ENCOUNTER — Telehealth: Payer: Self-pay | Admitting: *Deleted

## 2014-12-29 NOTE — Telephone Encounter (Signed)
Requesting surgical clearance:   1. Type of surgery: left: LT 1st MT scarf osteotomy, left 2nd hammertoes correction, LR 2nd MT Weil osteotomy, poss 2nd hammertoe correction  2. Surgeon: Dr Doran Durand  3. Surgical date: TBA  4. Medications that need to be help: ASA  5. CAD: Yes     Last cath: 12/2010 and CABG 01/08/2011     Last intervention: N/A, just CABG     Last nuclear study: 04/03/11-low risk with only mild ischemia at significant level of exertion     EF: 50-55% by echo 04/03/2011  6. I will defer to: Dr Gwenlyn Found

## 2015-01-10 NOTE — Telephone Encounter (Signed)
Okay to perform orthopedic surgery at low risk

## 2015-01-10 NOTE — Telephone Encounter (Signed)
I will route the clearance to [facility].

## 2015-02-09 ENCOUNTER — Other Ambulatory Visit: Payer: Self-pay | Admitting: Cardiovascular Disease

## 2015-03-07 ENCOUNTER — Other Ambulatory Visit: Payer: Self-pay | Admitting: Orthopedic Surgery

## 2015-04-02 ENCOUNTER — Encounter (HOSPITAL_BASED_OUTPATIENT_CLINIC_OR_DEPARTMENT_OTHER)
Admission: RE | Admit: 2015-04-02 | Discharge: 2015-04-02 | Disposition: A | Discharge status: Home / Self Care | Payer: Managed Care, Other (non HMO) | Source: Ambulatory Visit | Attending: Orthopedic Surgery | Admitting: Orthopedic Surgery

## 2015-04-02 ENCOUNTER — Encounter (HOSPITAL_BASED_OUTPATIENT_CLINIC_OR_DEPARTMENT_OTHER): Payer: Self-pay | Admitting: *Deleted

## 2015-04-02 DIAGNOSIS — I2510 Atherosclerotic heart disease of native coronary artery without angina pectoris: Secondary | ICD-10-CM | POA: Diagnosis not present

## 2015-04-02 DIAGNOSIS — Z6832 Body mass index (BMI) 32.0-32.9, adult: Secondary | ICD-10-CM | POA: Diagnosis not present

## 2015-04-02 DIAGNOSIS — I10 Essential (primary) hypertension: Secondary | ICD-10-CM | POA: Diagnosis not present

## 2015-04-02 DIAGNOSIS — M2042 Other hammer toe(s) (acquired), left foot: Secondary | ICD-10-CM | POA: Diagnosis not present

## 2015-04-02 DIAGNOSIS — Z951 Presence of aortocoronary bypass graft: Secondary | ICD-10-CM | POA: Diagnosis not present

## 2015-04-02 DIAGNOSIS — E785 Hyperlipidemia, unspecified: Secondary | ICD-10-CM | POA: Diagnosis not present

## 2015-04-02 DIAGNOSIS — Z7982 Long term (current) use of aspirin: Secondary | ICD-10-CM | POA: Diagnosis not present

## 2015-04-02 DIAGNOSIS — M7742 Metatarsalgia, left foot: Secondary | ICD-10-CM | POA: Diagnosis not present

## 2015-04-02 DIAGNOSIS — M2012 Hallux valgus (acquired), left foot: Secondary | ICD-10-CM | POA: Diagnosis not present

## 2015-04-02 DIAGNOSIS — Z79899 Other long term (current) drug therapy: Secondary | ICD-10-CM | POA: Diagnosis not present

## 2015-04-02 DIAGNOSIS — Q662 Congenital metatarsus (primus) varus: Secondary | ICD-10-CM | POA: Diagnosis not present

## 2015-04-02 LAB — POCT I-STAT, CHEM 8
BUN: 16 mg/dL (ref 6–20)
CALCIUM ION: 1.11 mmol/L — AB (ref 1.13–1.30)
CHLORIDE: 106 mmol/L (ref 101–111)
Creatinine, Ser: 1 mg/dL (ref 0.61–1.24)
Glucose, Bld: 86 mg/dL (ref 65–99)
HEMATOCRIT: 45 % (ref 39.0–52.0)
HEMOGLOBIN: 15.3 g/dL (ref 13.0–17.0)
Potassium: 4.1 mmol/L (ref 3.5–5.1)
SODIUM: 139 mmol/L (ref 135–145)
TCO2: 21 mmol/L (ref 0–100)

## 2015-04-05 ENCOUNTER — Encounter (HOSPITAL_BASED_OUTPATIENT_CLINIC_OR_DEPARTMENT_OTHER): Payer: Self-pay | Admitting: Certified Registered"

## 2015-04-05 ENCOUNTER — Ambulatory Visit (HOSPITAL_BASED_OUTPATIENT_CLINIC_OR_DEPARTMENT_OTHER): Payer: Managed Care, Other (non HMO) | Admitting: Certified Registered"

## 2015-04-05 ENCOUNTER — Ambulatory Visit (HOSPITAL_BASED_OUTPATIENT_CLINIC_OR_DEPARTMENT_OTHER)
Admission: RE | Admit: 2015-04-05 | Discharge: 2015-04-05 | Disposition: A | Discharge status: Home / Self Care | Payer: Managed Care, Other (non HMO) | Source: Ambulatory Visit | Attending: Orthopedic Surgery | Admitting: Orthopedic Surgery

## 2015-04-05 ENCOUNTER — Encounter (HOSPITAL_BASED_OUTPATIENT_CLINIC_OR_DEPARTMENT_OTHER)
Admission: RE | Disposition: A | Discharge status: Home / Self Care | Payer: Self-pay | Source: Ambulatory Visit | Attending: Orthopedic Surgery

## 2015-04-05 DIAGNOSIS — M2042 Other hammer toe(s) (acquired), left foot: Secondary | ICD-10-CM | POA: Insufficient documentation

## 2015-04-05 DIAGNOSIS — Z79899 Other long term (current) drug therapy: Secondary | ICD-10-CM | POA: Insufficient documentation

## 2015-04-05 DIAGNOSIS — Z6832 Body mass index (BMI) 32.0-32.9, adult: Secondary | ICD-10-CM | POA: Insufficient documentation

## 2015-04-05 DIAGNOSIS — M2012 Hallux valgus (acquired), left foot: Secondary | ICD-10-CM | POA: Insufficient documentation

## 2015-04-05 DIAGNOSIS — Z7982 Long term (current) use of aspirin: Secondary | ICD-10-CM | POA: Insufficient documentation

## 2015-04-05 DIAGNOSIS — M25572 Pain in left ankle and joints of left foot: Secondary | ICD-10-CM

## 2015-04-05 DIAGNOSIS — I2510 Atherosclerotic heart disease of native coronary artery without angina pectoris: Secondary | ICD-10-CM | POA: Insufficient documentation

## 2015-04-05 DIAGNOSIS — Q662 Congenital metatarsus (primus) varus: Secondary | ICD-10-CM | POA: Insufficient documentation

## 2015-04-05 DIAGNOSIS — Z951 Presence of aortocoronary bypass graft: Secondary | ICD-10-CM | POA: Insufficient documentation

## 2015-04-05 DIAGNOSIS — I10 Essential (primary) hypertension: Secondary | ICD-10-CM | POA: Insufficient documentation

## 2015-04-05 DIAGNOSIS — M7742 Metatarsalgia, left foot: Secondary | ICD-10-CM | POA: Insufficient documentation

## 2015-04-05 DIAGNOSIS — E785 Hyperlipidemia, unspecified: Secondary | ICD-10-CM | POA: Insufficient documentation

## 2015-04-05 HISTORY — PX: HAMMERTOE RECONSTRUCTION WITH WEIL OSTEOTOMY: SHX5631

## 2015-04-05 HISTORY — PX: METATARSAL OSTEOTOMY WITH BUNIONECTOMY: SHX5662

## 2015-04-05 LAB — POCT HEMOGLOBIN-HEMACUE: Hemoglobin: 14.1 g/dL (ref 13.0–17.0)

## 2015-04-05 SURGERY — BUNIONECTOMY, WITH METATARSAL OSTEOTOMY
Anesthesia: Regional | Site: Foot | Laterality: Left

## 2015-04-05 MED ORDER — SCOPOLAMINE 1 MG/3DAYS TD PT72: 1.0000 | MEDICATED_PATCH | Freq: Once | TRANSDERMAL | Status: DC | PRN

## 2015-04-05 MED ORDER — MIDAZOLAM HCL 2 MG/2ML IJ SOLN
INTRAMUSCULAR | Status: AC
  Filled 2015-04-05: qty 2

## 2015-04-05 MED ORDER — LACTATED RINGERS IV SOLN
INTRAVENOUS | Status: DC
  Administered 2015-04-05 (×2): via INTRAVENOUS

## 2015-04-05 MED ORDER — OXYCODONE HCL 5 MG/5ML PO SOLN: 5.0000 mg | Freq: Once | ORAL | Status: DC | PRN

## 2015-04-05 MED ORDER — DEXAMETHASONE SODIUM PHOSPHATE 10 MG/ML IJ SOLN
INTRAMUSCULAR | Status: DC | PRN
  Administered 2015-04-05: 10 mg via INTRAVENOUS

## 2015-04-05 MED ORDER — EPHEDRINE SULFATE 50 MG/ML IJ SOLN
INTRAMUSCULAR | Status: DC | PRN
  Administered 2015-04-05: 10 mg via INTRAVENOUS

## 2015-04-05 MED ORDER — LIDOCAINE HCL (CARDIAC) 20 MG/ML IV SOLN
INTRAVENOUS | Status: DC | PRN
  Administered 2015-04-05: 30 mg via INTRAVENOUS

## 2015-04-05 MED ORDER — BUPIVACAINE-EPINEPHRINE (PF) 0.5% -1:200000 IJ SOLN
INTRAMUSCULAR | Status: DC | PRN
  Administered 2015-04-05: 9 mL

## 2015-04-05 MED ORDER — MIDAZOLAM HCL 2 MG/2ML IJ SOLN
1.0000 mg | INTRAMUSCULAR | Status: DC | PRN
  Administered 2015-04-05: 2 mg via INTRAVENOUS

## 2015-04-05 MED ORDER — DOCUSATE SODIUM 100 MG PO CAPS: 100.0000 mg | ORAL_CAPSULE | Freq: Two times a day (BID) | ORAL | Status: DC

## 2015-04-05 MED ORDER — FENTANYL CITRATE (PF) 100 MCG/2ML IJ SOLN
INTRAMUSCULAR | Status: AC
  Filled 2015-04-05: qty 2

## 2015-04-05 MED ORDER — SENNA 8.6 MG PO TABS: 2.0000 | ORAL_TABLET | Freq: Two times a day (BID) | ORAL | Status: DC

## 2015-04-05 MED ORDER — OXYCODONE HCL 5 MG PO TABS: 5.0000 mg | ORAL_TABLET | ORAL | Status: DC | PRN

## 2015-04-05 MED ORDER — FENTANYL CITRATE (PF) 100 MCG/2ML IJ SOLN
INTRAMUSCULAR | Status: AC
  Filled 2015-04-05: qty 6

## 2015-04-05 MED ORDER — 0.9 % SODIUM CHLORIDE (POUR BTL) OPTIME
TOPICAL | Status: DC | PRN
  Administered 2015-04-05: 200 mL

## 2015-04-05 MED ORDER — CEFAZOLIN SODIUM-DEXTROSE 2-3 GM-% IV SOLR
INTRAVENOUS | Status: AC
  Filled 2015-04-05: qty 50

## 2015-04-05 MED ORDER — OXYCODONE HCL 5 MG PO TABS: 5.0000 mg | ORAL_TABLET | Freq: Once | ORAL | Status: DC | PRN

## 2015-04-05 MED ORDER — BUPIVACAINE-EPINEPHRINE (PF) 0.5% -1:200000 IJ SOLN
INTRAMUSCULAR | Status: DC | PRN
  Administered 2015-04-05: 30 mL via PERINEURAL

## 2015-04-05 MED ORDER — LIDOCAINE-EPINEPHRINE (PF) 1.5 %-1:200000 IJ SOLN
INTRAMUSCULAR | Status: DC | PRN
  Administered 2015-04-05: 30 mL via PERINEURAL

## 2015-04-05 MED ORDER — MEPERIDINE HCL 25 MG/ML IJ SOLN: 6.2500 mg | INTRAMUSCULAR | Status: DC | PRN

## 2015-04-05 MED ORDER — FENTANYL CITRATE (PF) 100 MCG/2ML IJ SOLN
50.0000 ug | INTRAMUSCULAR | Status: AC | PRN
  Administered 2015-04-05: 25 ug via INTRAVENOUS
  Administered 2015-04-05: 50 ug via INTRAVENOUS
  Administered 2015-04-05: 25 ug via INTRAVENOUS
  Administered 2015-04-05: 100 ug via INTRAVENOUS

## 2015-04-05 MED ORDER — PROPOFOL 10 MG/ML IV BOLUS
INTRAVENOUS | Status: DC | PRN
  Administered 2015-04-05: 250 mg via INTRAVENOUS

## 2015-04-05 MED ORDER — SODIUM CHLORIDE 0.9 % IV SOLN: INTRAVENOUS | Status: DC

## 2015-04-05 MED ORDER — HYDROMORPHONE HCL 1 MG/ML IJ SOLN: 0.2500 mg | INTRAMUSCULAR | Status: DC | PRN

## 2015-04-05 MED ORDER — CHLORHEXIDINE GLUCONATE 4 % EX LIQD: 60.0000 mL | Freq: Once | CUTANEOUS | Status: DC

## 2015-04-05 MED ORDER — ONDANSETRON HCL 4 MG/2ML IJ SOLN
INTRAMUSCULAR | Status: DC | PRN
  Administered 2015-04-05: 4 mg via INTRAVENOUS

## 2015-04-05 MED ORDER — GLYCOPYRROLATE 0.2 MG/ML IJ SOLN: 0.2000 mg | Freq: Once | INTRAMUSCULAR | Status: DC | PRN

## 2015-04-05 MED ORDER — CEFAZOLIN SODIUM-DEXTROSE 2-3 GM-% IV SOLR
2.0000 g | INTRAVENOUS | Status: AC
  Administered 2015-04-05: 2 g via INTRAVENOUS

## 2015-04-05 SURGICAL SUPPLY — 79 items
BANDAGE ESMARK 6X9 LF (GAUZE/BANDAGES/DRESSINGS) ×1 IMPLANT
BIT DRILL 1.7 CANN W/AO CONN (BIT) IMPLANT
BIT DRILL 1.7 LOW PROFILE (BIT) IMPLANT
BIT DRILL 2X3.5 HAND QK RELEAS (BIT) ×1 IMPLANT
BIT DRILL 2X3.5 QUICK RELEASE (BIT) ×3
BLADE AVERAGE 25MMX9MM (BLADE) ×2
BLADE AVERAGE 25X9 (BLADE) ×4 IMPLANT
BLADE MICRO SAGITTAL (BLADE) IMPLANT
BLADE OSC/SAG .038X5.5 CUT EDG (BLADE) ×3 IMPLANT
BLADE SURG 15 STRL LF DISP TIS (BLADE) ×4 IMPLANT
BLADE SURG 15 STRL SS (BLADE) ×8
BNDG COHESIVE 4X5 TAN STRL (GAUZE/BANDAGES/DRESSINGS) ×3 IMPLANT
BNDG COHESIVE 6X5 TAN STRL LF (GAUZE/BANDAGES/DRESSINGS) IMPLANT
BNDG CONFORM 3 STRL LF (GAUZE/BANDAGES/DRESSINGS) ×3 IMPLANT
BNDG ESMARK 6X9 LF (GAUZE/BANDAGES/DRESSINGS) ×3
CHLORAPREP W/TINT 26ML (MISCELLANEOUS) ×3 IMPLANT
COUNTERSINK MULTI SCREW (BIT) ×3
COVER BACK TABLE 60X90IN (DRAPES) ×3 IMPLANT
CUFF TOURNIQUET SINGLE 24IN (TOURNIQUET CUFF) IMPLANT
CUFF TOURNIQUET SINGLE 34IN LL (TOURNIQUET CUFF) ×3 IMPLANT
DRAPE EXTREMITY T 121X128X90 (DRAPE) ×3 IMPLANT
DRAPE OEC MINIVIEW 54X84 (DRAPES) ×3 IMPLANT
DRAPE U-SHAPE 47X51 STRL (DRAPES) ×3 IMPLANT
DRSG EMULSION OIL 3X3 NADH (GAUZE/BANDAGES/DRESSINGS) IMPLANT
DRSG MEPITEL 4X7.2 (GAUZE/BANDAGES/DRESSINGS) ×3 IMPLANT
ELECT REM PT RETURN 9FT ADLT (ELECTROSURGICAL) ×3
ELECTRODE REM PT RTRN 9FT ADLT (ELECTROSURGICAL) ×1 IMPLANT
GAUZE SPONGE 4X4 12PLY STRL (GAUZE/BANDAGES/DRESSINGS) ×6 IMPLANT
GLOVE BIO SURGEON STRL SZ8 (GLOVE) ×3 IMPLANT
GLOVE BIOGEL PI IND STRL 7.0 (GLOVE) ×1 IMPLANT
GLOVE BIOGEL PI IND STRL 8 (GLOVE) ×2 IMPLANT
GLOVE BIOGEL PI INDICATOR 7.0 (GLOVE) ×2
GLOVE BIOGEL PI INDICATOR 8 (GLOVE) ×4
GLOVE ECLIPSE 6.5 STRL STRAW (GLOVE) ×3 IMPLANT
GLOVE ECLIPSE 7.5 STRL STRAW (GLOVE) ×3 IMPLANT
GLOVE EXAM NITRILE MD LF STRL (GLOVE) ×3 IMPLANT
GOWN STRL REUS W/ TWL LRG LVL3 (GOWN DISPOSABLE) ×1 IMPLANT
GOWN STRL REUS W/ TWL XL LVL3 (GOWN DISPOSABLE) ×2 IMPLANT
GOWN STRL REUS W/TWL LRG LVL3 (GOWN DISPOSABLE) ×2
GOWN STRL REUS W/TWL XL LVL3 (GOWN DISPOSABLE) ×4
GUIDEWIRE .08 (WIRE) IMPLANT
GUIDEWIRE ORTHO 062 (WIRE) ×6 IMPLANT
K-WIRE .054X4 (WIRE) ×3 IMPLANT
KIT SUT CPR VIP IMPL DBL PIGTA (Orthopedic Implant) ×3 IMPLANT
NEEDLE HYPO 25X1 1.5 SAFETY (NEEDLE) ×3 IMPLANT
NS IRRIG 1000ML POUR BTL (IV SOLUTION) ×3 IMPLANT
PACK BASIN DAY SURGERY FS (CUSTOM PROCEDURE TRAY) ×3 IMPLANT
PAD CAST 4YDX4 CTTN HI CHSV (CAST SUPPLIES) ×1 IMPLANT
PADDING CAST ABS 4INX4YD NS (CAST SUPPLIES)
PADDING CAST ABS COTTON 4X4 ST (CAST SUPPLIES) IMPLANT
PADDING CAST COTTON 4X4 STRL (CAST SUPPLIES) ×2
PENCIL BUTTON HOLSTER BLD 10FT (ELECTRODE) ×3 IMPLANT
SANITIZER HAND PURELL 535ML FO (MISCELLANEOUS) ×3 IMPLANT
SCREW BONE LAG 2.3X16MM HEXA (Screw) ×1 IMPLANT
SCREW BONE NL 2.3X20MM HEXA (Screw) ×1 IMPLANT
SCREW COUNTERSINK MULTI SCREW (BIT) ×1 IMPLANT
SCREW FUSION ACUTRAK 30 3000 (Screw) ×1 IMPLANT
SCREW FUSION ACUTRAK 30MM 3000 (Screw) ×3 IMPLANT
SCREW LAG 2.3X16 (Screw) ×3 IMPLANT
SCREW NONLOCK 2.3X20MM (Screw) ×3 IMPLANT
SCREW QUICK SNAP 2.0X14MM (Screw) ×9 IMPLANT
SHEET MEDIUM DRAPE 40X70 STRL (DRAPES) ×3 IMPLANT
SLEEVE SCD COMPRESS KNEE MED (MISCELLANEOUS) ×3 IMPLANT
SPONGE LAP 18X18 X RAY DECT (DISPOSABLE) ×3 IMPLANT
STOCKINETTE 6  STRL (DRAPES) ×2
STOCKINETTE 6 STRL (DRAPES) ×1 IMPLANT
SUCTION FRAZIER TIP 10 FR DISP (SUCTIONS) ×3 IMPLANT
SUT ETHIBOND 3-0 V-5 (SUTURE) IMPLANT
SUT ETHILON 3 0 PS 1 (SUTURE) ×6 IMPLANT
SUT MNCRL AB 3-0 PS2 18 (SUTURE) ×3 IMPLANT
SUT VIC AB 0 SH 27 (SUTURE) IMPLANT
SUT VIC AB 2-0 SH 27 (SUTURE) ×2
SUT VIC AB 2-0 SH 27XBRD (SUTURE) ×1 IMPLANT
SYR BULB 3OZ (MISCELLANEOUS) ×3 IMPLANT
SYR CONTROL 10ML LL (SYRINGE) ×3 IMPLANT
TOWEL OR 17X24 6PK STRL BLUE (TOWEL DISPOSABLE) ×6 IMPLANT
TUBE CONNECTING 20'X1/4 (TUBING) ×1
TUBE CONNECTING 20X1/4 (TUBING) ×2 IMPLANT
UNDERPAD 30X30 (UNDERPADS AND DIAPERS) ×3 IMPLANT

## 2015-04-05 NOTE — Progress Notes (Signed)
Assisted Dr. Ossey with left, ultrasound guided, popliteal/saphenous block. Side rails up, monitors on throughout procedure. See vital signs in flow sheet. Tolerated Procedure well. 

## 2015-04-05 NOTE — Transfer of Care (Signed)
Immediate Anesthesia Transfer of Care Note  Patient: Derek Hayden  Procedure(s) Performed: Procedure(s): LEFT FIRST METATARSAL  SCARF OSTEOTOMY, MODIFIED MCBRIDE BUNIONECTOMY (Left) SECOND - FOURTH WEIL OSTEOTOMY WITH HAMMERTOE CORRECTION; PLANTAR PLATE REPAIR (Left)  Patient Location: PACU  Anesthesia Type:GA combined with regional for post-op pain  Level of Consciousness: awake and patient cooperative  Airway & Oxygen Therapy: Patient Spontanous Breathing and Patient connected to face mask oxygen  Post-op Assessment: Report given to RN and Post -op Vital signs reviewed and stable  Post vital signs: Reviewed and stable  Last Vitals:  Filed Vitals:   04/05/15 0908  BP:   Pulse: 70  Temp:   Resp: 11    Complications: No apparent anesthesia complications

## 2015-04-05 NOTE — Anesthesia Postprocedure Evaluation (Signed)
  Anesthesia Post-op Note  Patient: Derek Hayden  Procedure(s) Performed: Procedure(s): LEFT FIRST METATARSAL  SCARF OSTEOTOMY, MODIFIED MCBRIDE BUNIONECTOMY (Left) SECOND - FOURTH WEIL OSTEOTOMY WITH HAMMERTOE CORRECTION; PLANTAR PLATE REPAIR (Left)  Patient Location: PACU  Anesthesia Type: General, Regional   Level of Consciousness: awake, alert  and oriented  Airway and Oxygen Therapy: Patient Spontanous Breathing  Post-op Pain: none  Post-op Assessment: Post-op Vital signs reviewed  Post-op Vital Signs: Reviewed  Last Vitals:  Filed Vitals:   04/05/15 1230  BP: 161/92  Pulse: 92  Temp:   Resp: 10    Complications: No apparent anesthesia complications

## 2015-04-05 NOTE — Anesthesia Procedure Notes (Addendum)
Anesthesia Regional Block:  Popliteal block  Pre-Anesthetic Checklist: ,, timeout performed, Correct Patient, Correct Site, Correct Laterality, Correct Procedure, Correct Position, site marked, Risks and benefits discussed,  Surgical consent,  Pre-op evaluation,  At surgeon's request and post-op pain management  Laterality: Left  Prep: chloraprep       Needles:  Injection technique: Single-shot  Needle Type: Echogenic Stimulator Needle     Needle Length: 10cm 10 cm Needle Gauge: 21 and 21 G    Additional Needles:  Procedures: ultrasound guided (picture in chart) and nerve stimulator Popliteal block  Nerve Stimulator or Paresthesia:  Response: 0.4 mA,   Additional Responses:   Narrative:  Start time: 04/05/2015 8:55 AM End time: 04/05/2015 9:05 AM Injection made incrementally with aspirations every 5 mL.  Performed by: Personally  Anesthesiologist: Lillia Abed  Additional Notes: Monitors applied. Patient sedated. Sterile prep and drape,hand hygiene and sterile gloves were used. Relevant anatomy identified.Needle position confirmed.Local anesthetic injected incrementally after negative aspiration. Local anesthetic spread visualized around nerve(s). Vascular puncture avoided. No complications. Image printed for medical record.The patient tolerated the procedure well.  Additional Saphenous nerve block performed. 15cc Local Anesthetic mixture placed under ultrasonic guidance along the medio-inferior border of the Sartorious muscle 6 inches above the knee.  No Problems encountered.  Lillia Abed MD    Procedure Name: LMA Insertion Date/Time: 04/05/2015 9:42 AM Performed by: Nahomy Limburg D Pre-anesthesia Checklist: Patient identified, Emergency Drugs available, Suction available and Patient being monitored Patient Re-evaluated:Patient Re-evaluated prior to inductionOxygen Delivery Method: Circle System Utilized Preoxygenation: Pre-oxygenation with 100% oxygen Intubation  Type: IV induction Ventilation: Mask ventilation without difficulty LMA: LMA inserted LMA Size: 4.0 Number of attempts: 1 Airway Equipment and Method: Bite block Placement Confirmation: positive ETCO2 Tube secured with: Tape Dental Injury: Teeth and Oropharynx as per pre-operative assessment
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
## 2015-04-05 NOTE — Brief Op Note (Signed)
04/05/2015  11:41 AM  PATIENT:  Derek Hayden  64 y.o. male  PRE-OPERATIVE DIAGNOSIS: 1.  Left hallux valgus      2.  Left metatarsus primus varus      3.  Left 2nd crossover toe with hammertoe and plantar plate tear      4.  Left 2-4 metatarsalgia  POST-OPERATIVE DIAGNOSIS:  same  Procedure(s): 1.  LEFT FIRST METATARSAL  SCARF OSTEOTOMY 2.  Left MODIFIED MCBRIDE BUNIONECTOMY (separate incision) 3.  Left 2nd, 3rd and 4th MT weil osteotomies (separate incisions) 4.  Left 2nd plantar plate reconstruction and collateral ligament repair 5.  Left 2nd hammertoe correction (separate incision) 6.  Left foot AP and lateral xrays  SURGEON:  Wylene Simmer, MD  ASSISTANT: Mechele Claude, PA-C  ANESTHESIA:   General, regional  EBL:  minimal   TOURNIQUET:   Total Tourniquet Time Documented: Thigh (Left) - 91 minutes Total: Thigh (Left) - 91 minutes  COMPLICATIONS:  None apparent  DISPOSITION:  Extubated, awake and stable to recovery.  DICTATION ID:  [id number]

## 2015-04-05 NOTE — Anesthesia Preprocedure Evaluation (Addendum)
Anesthesia Evaluation  Patient identified by MRN, date of birth, ID band Patient awake    Reviewed: Allergy & Precautions, NPO status   Airway Mallampati: I  TM Distance: >3 FB Neck ROM: Full    Dental  (+) Teeth Intact, Dental Advisory Given   Pulmonary  breath sounds clear to auscultation        Cardiovascular hypertension, Pt. on home beta blockers and Pt. on medications + CAD Rhythm:Regular Rate:Normal     Neuro/Psych    GI/Hepatic   Endo/Other  Morbid obesity  Renal/GU      Musculoskeletal   Abdominal   Peds  Hematology   Anesthesia Other Findings   Reproductive/Obstetrics                            Anesthesia Physical Anesthesia Plan  ASA: II  Anesthesia Plan: General and Regional   Post-op Pain Management:    Induction: Intravenous  Airway Management Planned: LMA  Additional Equipment:   Intra-op Plan:   Post-operative Plan: Extubation in OR  Informed Consent: I have reviewed the patients History and Physical, chart, labs and discussed the procedure including the risks, benefits and alternatives for the proposed anesthesia with the patient or authorized representative who has indicated his/her understanding and acceptance.   Dental advisory given  Plan Discussed with: CRNA, Anesthesiologist and Surgeon  Anesthesia Plan Comments:         Anesthesia Quick Evaluation

## 2015-04-05 NOTE — Discharge Instructions (Addendum)
Derek Simmer, MD Derek Hayden  Please read the following information regarding your care after surgery.  Medications  You only need a prescription for the narcotic pain medicine (ex. oxycodone, Percocet, Norco).  All of the other medicines listed below are available over the counter. X acetominophen (Tylenol) 650 mg every 4-6 hours as you need for minor pain X oxycodone as prescribed for moderate to severe pain ?   Narcotic pain medicine (ex. oxycodone, Percocet, Vicodin) will cause constipation.  To prevent this problem, take the following medicines while you are taking any pain medicine. X docusate sodium (Colace) 100 mg twice a day X senna (Senokot) 2 tablets twice a day   Weight Bearing X Bear weight only on the heel of your operated foot in the post-op shoe.   Cast / Splint / Dressing X Keep your splint or cast clean and dry.  Dont put anything (coat hanger, pencil, etc) down inside of it.  If it gets damp, use a hair dryer on the cool setting to dry it.  If it gets soaked, call the office to schedule an appointment for a cast change.   After your dressing, cast or splint is removed; you may shower, but do not soak or scrub the wound.  Allow the water to run over it, and then gently pat it dry.  Swelling It is normal for you to have swelling where you had surgery.  To reduce swelling and pain, keep your toes above your nose for at least 3 days after surgery.  It may be necessary to keep your foot or leg elevated for several weeks.  If it hurts, it should be elevated.  Follow Up Call my office at [phone] when you are discharged from the hospital or surgery center to schedule an appointment to be seen two weeks after surgery.  Call my office at [phone] if you develop a fever >101.5 F, nausea, vomiting, bleeding from the surgical site or severe pain.     Regional Anesthesia Blocks  1. Numbness or the inability to move the "blocked" extremity may last from  3-48 hours after placement. The length of time depends on the medication injected and your individual response to the medication. If the numbness is not going away after 48 hours, call your surgeon.  2. The extremity that is blocked will need to be protected until the numbness is gone and the  Strength has returned. Because you cannot feel it, you will need to take extra care to avoid injury. Because it may be weak, you may have difficulty moving it or using it. You may not know what position it is in without looking at it while the block is in effect.  3. For blocks in the legs and feet, returning to weight bearing and walking needs to be done carefully. You will need to wait until the numbness is entirely gone and the strength has returned. You should be able to move your leg and foot normally before you try and bear weight or walk. You will need someone to be with you when you first try to ensure you do not fall and possibly risk injury.  4. Bruising and tenderness at the needle site are common side effects and will resolve in a few days.  5. Persistent numbness or new problems with movement should be communicated to the surgeon or the [facility] [phone] [place] [phone]).   Post Anesthesia Home Care Instructions  Activity: Get plenty of rest  for the remainder of the day. A responsible adult should stay with you for 24 hours following the procedure.  For the next 24 hours, DO NOT: -Drive a car -Paediatric nurse -Drink alcoholic beverages -Take any medication unless instructed by your physician -Make any legal decisions or sign important papers.  Meals: Start with liquid foods such as gelatin or soup. Progress to regular foods as tolerated. Avoid greasy, spicy, heavy foods. If nausea and/or vomiting occur, drink only clear liquids until the nausea and/or vomiting subsides. Call your physician if vomiting continues.  Special  Instructions/Symptoms: Your throat may feel dry or sore from the anesthesia or the breathing tube placed in your throat during surgery. If this causes discomfort, gargle with warm salt water. The discomfort should disappear within 24 hours.  If you had a scopolamine patch placed behind your ear for the management of post- operative nausea and/or vomiting:  1. The medication in the patch is effective for 72 hours, after which it should be removed.  Wrap patch in a tissue and discard in the trash. Wash hands thoroughly with soap and water. 2. You may remove the patch earlier than 72 hours if you experience unpleasant side effects which may include dry mouth, dizziness or visual disturbances. 3. Avoid touching the patch. Wash your hands with soap and water after contact with the patch.

## 2015-04-06 ENCOUNTER — Encounter (HOSPITAL_BASED_OUTPATIENT_CLINIC_OR_DEPARTMENT_OTHER): Payer: Self-pay | Admitting: Orthopedic Surgery

## 2015-04-06 NOTE — Op Note (Signed)
Derek Hayden, Derek Hayden NO.:  [id number]  MEDICAL RECORD NO.:  [id number]  LOCATION:                                 FACILITY:  PHYSICIAN:  Wylene Simmer, MD             DATE OF BIRTH:  DATE OF PROCEDURE:  04/05/2015 DATE OF DISCHARGE:                              OPERATIVE REPORT   PREOPERATIVE DIAGNOSES: 1. Left hallux valgus. 2. Left metatarsus primus varus. 3. Left second crossover toe with hammertoe and plantar plate tear. 4. Left third and fourth metatarsal metatarsalgia.  POSTOPERATIVE DIAGNOSES: 1. Left hallux valgus. 2. Left metatarsus primus varus. 3. Left second crossover toe with hammertoe and plantar plate tear. 4. Left third and fourth metatarsal metatarsalgia.  PROCEDURE: 1. Left first metatarsal SCARF osteotomy. 2. Left modified McBride bunionectomy through a separate incision. 3. Left second, third, and fourth metatarsal Weil osteotomies through     separate incisions. 4. Left second metatarsophalangeal joint plantar plate reconstruction     and collateral ligament repair. 5. Left second hammertoe correction through a separate incision. 6. Left foot AP and lateral radiographs.  SURGEON:  Wylene Simmer, MD  ASSISTANT:  Mechele Claude, PA-C.  ANESTHESIA:  General, regional.  ESTIMATED BLOOD LOSS:  Minimal.  TOURNIQUET TIME:  91 minutes at 250 mmHg.  COMPLICATIONS:  None apparent.  DISPOSITION:  Extubated, awake, and stable to recovery.  INDICATIONS FOR PROCEDURE:  The patient is a 64 year old male, who has a past medical history significant for coronary artery disease.  He complains of a painful left foot bunion deformity with second crossover toe.  He also has signs and symptoms consistent with second, third, and fourth metatarsalgia.  He presents now for operative treatment of these painful conditions.  He understands the risks and benefits, the alternative treatment options, and elects surgical treatment.  He specifically  understands risks of bleeding, infection, nerve damage, blood clots, need for additional surgery, continued pain, recurrence of his deformities, amputation, and death.  PROCEDURE IN DETAIL:  After preoperative consent was obtained and the correct operative site was identified, the patient was brought to the operating room and placed supine on the operating table.  General anesthesia was induced.  Preoperative antibiotics were administered. Surgical time-out was taken.  Left lower extremity was prepped and draped in standard sterile fashion with tourniquet around the thigh. Extremity was exsanguinated and the tourniquet was inflated to 250 mmHg. A curvilinear incision was then made in the dorsal aspect of the first webspace.  Sharp dissection was carried down through skin and subcutaneous tissue.  The intermetatarsal ligament was divided under direct vision.  An arthrotomy was then made between the lateral sesamoid and metatarsal head.  Several perforations were made in the lateral joint capsule.  The hallux could then be positioned in 20 degrees of varus passively.  Attention was then turned to the medial eminence where a longitudinal incision was made.  Sharp dissection was carried down through the skin and subcutaneous tissue and joint capsule.  The joint capsule was elevated plantarly and dorsally.  The medial eminence was then resected with an oscillating saw in line with the first metatarsal shaft.  A SCARF osteotomy was then made in the first metatarsal shaft.  The osteotomy site was mobilized and the metatarsal head was translated laterally correcting the intermetatarsal angle.  The osteotomy was then fixed with two 2.3 mm screws from the Acumed set.  The overhanging bone was trimmed with an oscillating saw.  AP and lateral radiographs confirmed appropriate correction of the hallux, valgus, and intermetatarsal angles, and appropriate position and length of  both screws.  Attention was then turned to the second MTP joint.  Blunt dissection was carried down to the joint capsule.  An arthrotomy was made at the medial joint capsule and the extensor tendons were protected.  A Weil osteotomy was then made with the oscillating saw and the metatarsal head was allowed to retract proximally.  The plantar plate was carefully examined.  There was a full-thickness tear of the plantar plate over the lateral one-third as well as attenuation of the collateral ligament laterally.  Decision was made to proceed with plantar plate reconstruction and collateral ligament repair.  A McGlamry elevator was used to release the plantar plate proximally and distally.  The leading edge of the plantar plate was grasped with two 2-0 FiberWire sutures. Drill holes were placed in the base of the proximal phalanx and the sutures were passed through the drill holes and the plantar plate was tied down to the base of the proximal phalanx repairing it appropriately.  The Weil osteotomy was then fixed to the appropriate length with a 2-mm twist off screw from the Acumed set.  Attention was turned to the third MTP joint where longitudinal incision was made.  Sharp dissection was carried down through skin and subcutaneous tissue.  The metatarsal head was exposed.  The extensor tendons were protected.  A Weil osteotomy was again performed and the metatarsal head was allowed to translate proximally several mm.  The osteotomy was fixed with a 2 mm screw.  The overhanging bone was trimmed with a rongeur.  The same procedure was then repeated through a separate incision for the fourth MTP joint.  Attention was then turned to the second toe where a transverse incision was made.  Sharp dissection was carried down through skin, subcutaneous tissue, and extensor mechanism.  The head of the proximal phalanx was resected along with the base of the middle phalanx.  The arthrodesis  was then fixed with an Acumed Acutrak screw.  AP and lateral radiographs confirmed appropriate position of the hardware and appropriate correction of the hammertoe deformity.  2-0 Vicryl box sutures were placed in the lateral collateral ligament of the second MTP joint tightening it appropriately.  Final AP and lateral radiographs confirmed appropriate position and length of all hardware and appropriate correction of the forefoot pathology.  Wounds were irrigated copiously.  The medial joint capsule was repaired at the hallux with imbricating sutures of 2-0 Vicryl.  Skin was closed with running nylon.  The remaining incisions were closed with Monocryl and nylon.  Sterile dressings were applied followed by a well-padded bunion wrap.  The patient's tourniquet was released at 90 minutes.  The patient was awakened from anesthesia and transported to the recovery room in stable condition.  FOLLOWUP PLAN:  The patient will be weightbearing as tolerated on his left heel in a Darco wedge style shoe.  He will follow up with me in 2 weeks for suture removal.  Mechele Claude, PA-C was present and scrubbed for the duration of the case.  His assistance was essential in positioning the  patient, gaining and maintaining exposure, performing the operation, closing and dressing the wounds, and applying the splint.  RADIOGRAPHS:  AP and lateral radiographs of the left foot were obtained intraoperatively.  These show interval correction of hallux valgus and metatarsus primus varus deformities as well as second, third, and fourth metatarsalgia and second hammertoe deformity.  Second crossover toe was also repaired appropriately.  Hardware is appropriately positioned and of the appropriate length.  No other acute injuries are noted.     Wylene Simmer, MD     JH/MEDQ  D:  04/05/2015  T:  04/06/2015  Job:  [id number]

## 2015-05-09 ENCOUNTER — Encounter: Payer: Self-pay | Admitting: Cardiovascular Disease

## 2015-05-09 ENCOUNTER — Ambulatory Visit (INDEPENDENT_AMBULATORY_CARE_PROVIDER_SITE_OTHER): Payer: Managed Care, Other (non HMO) | Admitting: Cardiovascular Disease

## 2015-05-09 ENCOUNTER — Telehealth: Payer: Self-pay | Admitting: Cardiovascular Disease

## 2015-05-09 VITALS — BP 136/86 | HR 66 | Ht 65.0 in | Wt 202.0 lb

## 2015-05-09 DIAGNOSIS — E785 Hyperlipidemia, unspecified: Secondary | ICD-10-CM | POA: Diagnosis not present

## 2015-05-09 DIAGNOSIS — I10 Essential (primary) hypertension: Secondary | ICD-10-CM

## 2015-05-09 DIAGNOSIS — I2510 Atherosclerotic heart disease of native coronary artery without angina pectoris: Secondary | ICD-10-CM | POA: Diagnosis not present

## 2015-05-09 DIAGNOSIS — Z951 Presence of aortocoronary bypass graft: Secondary | ICD-10-CM | POA: Diagnosis not present

## 2015-05-09 MED ORDER — LOSARTAN POTASSIUM 25 MG PO TABS: 25.0000 mg | ORAL_TABLET | Freq: Every day | ORAL | Status: DC

## 2015-05-09 NOTE — Assessment & Plan Note (Signed)
History of hyperlipidemia on Lipitor statin and Zetia followed by his PCP.

## 2015-05-09 NOTE — Assessment & Plan Note (Signed)
History of hypertension with blood pressure measurements at 136/86. He is on losartan and metoprolol. Continue current meds at current dosing

## 2015-05-09 NOTE — Progress Notes (Signed)
05/09/2015 Derek Hayden   Jun 23, 1951  [id number]  Primary Physician Tivis Ringer, MD Primary Cardiologist: Lorretta Harp MD [place], [place]   HPI:  Derek Hayden is a 64y.o. mildly overweight, divorced, Caucasian male father of 27, grandfather to 2 grandchildren who Dr. Gwenlyn Found last saw on 12/10/11. 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 He has a history of CAD status post cath Jan 07, 2011 revealing left main 2-vessel disease with mild LV dysfunction. He ultimately underwent coronary artery bypass grafting x4 by Dr. Tharon Aquas Trigt with a LIMA to his LAD, a vein to a diagonal branch, OM and PDA. His hospital course was uncomplicated, and he recuperated nicely. He had an echo performed April 03, 2011 that was normal with some mild septal hypokinesia and a Myoview that was normal as well. I saw him in the office 1 year ago . Since that time he's denied chest pain or shortness of breath.   Current Outpatient Prescriptions  Medication Sig Dispense Refill  . aspirin 162 MG EC tablet Take 162 mg by mouth daily.      Marland Kitchen atorvastatin (LIPITOR) 20 MG tablet Take 1 tablet (20 mg total) by mouth daily. 90 tablet 3  . ezetimibe (ZETIA) 10 MG tablet Take 10 mg by mouth daily.    Marland Kitchen losartan (COZAAR) 25 MG tablet TAKE 1 TABLET BY MOUTH ONCE DAILY 90 tablet 0  . metoprolol tartrate (LOPRESSOR) 25 MG tablet Take 0.5 tablets (12.5 mg total) by mouth 3 (three) times daily. 135 tablet 2  . Multiple Vitamin (MULTIVITAMIN PO) Take 1 tablet by mouth daily.       No current facility-administered medications for this visit.    No Known Allergies  Social History   Social History  . Marital Status: Single    Spouse Name: N/A  . Number of Children: N/A  . Years of Education: N/A   Occupational History  . Not on file.   Social History Main Topics  . Smoking status: Never Smoker   . Smokeless tobacco: Never Used  . Alcohol Use: No  . Drug Use: No   . Sexual Activity: Not on file   Other Topics Concern  . Not on file   Social History Narrative     Review of Systems: General: negative for chills, fever, night sweats or weight changes.  Cardiovascular: negative for chest pain, dyspnea on exertion, edema, orthopnea, palpitations, paroxysmal nocturnal dyspnea or shortness of breath Dermatological: negative for rash Respiratory: negative for cough or wheezing Urologic: negative for hematuria Abdominal: negative for nausea, vomiting, diarrhea, bright red blood per rectum, melena, or hematemesis Neurologic: negative for visual changes, syncope, or dizziness All other systems reviewed and are otherwise negative except as noted above.    Blood pressure 136/86, pulse 66, height 5\' 5"  (1.651 m), weight 202 lb (91.627 kg).  General appearance: alert and no distress Neck: no adenopathy, no carotid bruit, no JVD, supple, symmetrical, trachea midline and thyroid not enlarged, symmetric, no tenderness/mass/nodules Lungs: clear to auscultation bilaterally Heart: regular rate and rhythm, S1, S2 normal, no murmur, click, rub or gallop Extremities: extremities normal, atraumatic, no cyanosis or edema  EKG normal sinus rhythm at 66 with nonspecific ST and T-wave changes. I personally reviewed this EKG  ASSESSMENT AND PLAN:   S/P CABG x 4: Jan 08, 2011,  Dr. Prescott Gum, LIMA to the LAD, SVG to diag, SVG to OM, SVG to PDA.   History of  CAD status post coronary artery bypass grafting or Dr. Prescott Gum  01/08/11 the LIMA to the LAD, vein graft to diagonal branch, obtuse marginal branch and PDA. He denies chest pain or shortness of breath. A subsequent Myoview was normal.  Hyperlipidemia History of hyperlipidemia on Lipitor statin and Zetia followed by his PCP.  HTN (hypertension) History of hypertension with blood pressure measurements at 136/86. He is on losartan and metoprolol. Continue current meds at current dosing      Lorretta Harp  MD [facility] 05/09/2015 2:26 PM

## 2015-05-09 NOTE — Telephone Encounter (Signed)
°  1. Which medications need to be refilled? Losartan 25mg   2. Which pharmacy is medication to be sent to?[facility]   3. Do they need a 30 day or 90 day supply? 90  4. Would they like a call back once the medication has been sent to the pharmacy? yes

## 2015-05-09 NOTE — Telephone Encounter (Signed)
Lorsartan 25 mg refilled  Called and LVM that medication has been sent to pharmacy of [facility]

## 2015-05-09 NOTE — Assessment & Plan Note (Signed)
History of CAD status post coronary artery bypass grafting or Dr. Prescott Gum  01/08/11 the LIMA to the LAD, vein graft to diagonal branch, obtuse marginal branch and PDA. He denies chest pain or shortness of breath. A subsequent Myoview was normal.

## 2015-05-09 NOTE — Patient Instructions (Signed)
Medication Instructions:  Your physician recommends that you continue on your current medications as directed. Please refer to the Current Medication list given to you today.   Labwork: NONE  Testing/Procedures: None  Follow-Up: Your physician wants you to follow-up in: 12 months with Dr. Gwenlyn Found. You will receive a reminder letter in the mail two months in advance. If you don't receive a letter, please call our office to schedule the follow-up appointment.   Any Other Special Instructions Will Be Listed Below (If Applicable).

## 2015-06-06 ENCOUNTER — Telehealth: Payer: Self-pay | Admitting: Cardiovascular Disease

## 2015-06-06 MED ORDER — METOPROLOL TARTRATE 25 MG PO TABS: 12.5000 mg | ORAL_TABLET | Freq: Three times a day (TID) | ORAL | Status: DC

## 2015-06-06 NOTE — Telephone Encounter (Signed)
Refill submitted to patient's preferred pharmacy.  

## 2015-06-06 NOTE — Telephone Encounter (Signed)
°  1. Which medications need to be refilled? Metoprolol  2. Which pharmacy is medication to be sent to?[facility]-[phone]  3. Do they need a 30 day or 90 day supply? 90 and refills

## 2015-06-25 ENCOUNTER — Telehealth: Payer: Self-pay | Admitting: Cardiovascular Disease

## 2015-06-25 ENCOUNTER — Encounter: Payer: Self-pay | Admitting: Cardiovascular Disease

## 2015-06-26 ENCOUNTER — Encounter: Payer: Self-pay | Admitting: Cardiology

## 2015-07-03 NOTE — Telephone Encounter (Signed)
Note done in error.

## 2015-08-13 ENCOUNTER — Telehealth: Payer: Self-pay | Admitting: Cardiovascular Disease

## 2015-08-13 ENCOUNTER — Other Ambulatory Visit: Payer: Self-pay | Admitting: Cardiovascular Disease

## 2015-08-13 NOTE — Telephone Encounter (Signed)
°  Follow Up   *STAT* If patient is at the pharmacy, call can be transferred to refill team.   1. Which medications need to be refilled? (please list name of each medication and dose if known) Lipitor 20 mg  2. Which pharmacy/location (including street and city if local pharmacy) is medication to be sent to?  3. Do they need a 30 day or 90 day supply? 90 days

## 2015-08-14 MED ORDER — ATORVASTATIN CALCIUM 20 MG PO TABS: 20.0000 mg | ORAL_TABLET | Freq: Every day | ORAL | Status: DC

## 2015-08-14 NOTE — Telephone Encounter (Signed)
Rx send to pt pharmacy

## 2015-08-29 MED FILL — EZETIMIBE 10 MG TABLET: 10 | 90 days supply | Qty: 90 | Fill #0

## 2015-08-30 MED FILL — METOPROLOL TARTRATE 25 MG T: 25 | 90 days supply | Qty: 135 | Fill #1

## 2015-11-07 MED FILL — LOSARTAN POTASSIUM 25 MG TA: 25 | 90 days supply | Qty: 90 | Fill #2

## 2015-11-07 MED FILL — ATORVASTATIN 20 MG TABLET: 20 | 90 days supply | Qty: 90 | Fill #1

## 2015-11-08 MED FILL — METHOCARBAMOL 500 MG TABLET: 500 | 20 days supply | Qty: 60 | Fill #0

## 2015-11-26 MED FILL — EZETIMIBE 10 MG TABLET: 10 | 90 days supply | Qty: 90 | Fill #1

## 2015-11-26 MED FILL — ROSUVASTATIN CALCIUM 5 MG T: 5 | 90 days supply | Qty: 90 | Fill #0

## 2015-11-26 MED FILL — METOPROLOL TARTRATE 25 MG T: 25 | 90 days supply | Qty: 135 | Fill #2

## 2015-12-05 MED FILL — METHOCARBAMOL 500 MG TABLET: 500 | 20 days supply | Qty: 60 | Fill #1

## 2016-01-31 MED FILL — LOSARTAN POTASSIUM 25 MG TA: 25 | 90 days supply | Qty: 90 | Fill #3

## 2016-02-01 MED FILL — NAPROXEN 500 MG TABLET: 500 | 15 days supply | Qty: 30 | Fill #0

## 2016-02-01 MED FILL — METHOCARBAMOL 500 MG TABLET: 500 | 20 days supply | Qty: 60 | Fill #0

## 2016-02-22 MED FILL — EZETIMIBE 10 MG TABLET: 10 | 90 days supply | Qty: 90 | Fill #2

## 2016-02-22 MED FILL — METOPROLOL TARTRATE 25 MG T: 25 | 90 days supply | Qty: 135 | Fill #3

## 2016-02-27 MED FILL — METHOCARBAMOL 500 MG TABLET: 500 | 20 days supply | Qty: 60 | Fill #1

## 2016-02-27 MED FILL — NAPROXEN 500 MG TABLET: 500 | 15 days supply | Qty: 30 | Fill #1

## 2016-03-31 MED FILL — NAPROXEN 500 MG TABLET: 500 | 15 days supply | Qty: 30 | Fill #2

## 2016-04-16 ENCOUNTER — Encounter: Payer: Self-pay | Admitting: Gastroenterology

## 2016-05-13 ENCOUNTER — Ambulatory Visit (INDEPENDENT_AMBULATORY_CARE_PROVIDER_SITE_OTHER): Payer: Managed Care, Other (non HMO) | Admitting: Cardiovascular Disease

## 2016-05-13 ENCOUNTER — Encounter: Payer: Self-pay | Admitting: Cardiovascular Disease

## 2016-05-13 VITALS — BP 142/88 | HR 60 | Ht 65.0 in | Wt 193.8 lb

## 2016-05-13 DIAGNOSIS — E785 Hyperlipidemia, unspecified: Secondary | ICD-10-CM

## 2016-05-13 DIAGNOSIS — I10 Essential (primary) hypertension: Secondary | ICD-10-CM

## 2016-05-13 DIAGNOSIS — Z951 Presence of aortocoronary bypass graft: Secondary | ICD-10-CM | POA: Diagnosis not present

## 2016-05-13 DIAGNOSIS — Z79899 Other long term (current) drug therapy: Secondary | ICD-10-CM

## 2016-05-13 LAB — LIPID PANEL
CHOL/HDL RATIO: 4 ratio (ref <=5.0)
CHOLESTEROL: 174 mg/dL (ref 125–200)
HDL: 44 mg/dL (ref >=40)
LDL CALC: 110 mg/dL (ref <130)
Triglycerides: 100 mg/dL (ref <150)
VLDL: 20 mg/dL (ref <30)

## 2016-05-13 LAB — HEPATIC FUNCTION PANEL
ALK PHOS: 65 U/L (ref 40–115)
ALT: 15 U/L (ref 9–46)
AST: 18 U/L (ref 10–35)
Albumin: 4 g/dL (ref 3.6–5.1)
BILIRUBIN DIRECT: 0.1 mg/dL (ref <=0.2)
BILIRUBIN INDIRECT: 0.4 mg/dL (ref 0.2–1.2)
BILIRUBIN TOTAL: 0.5 mg/dL (ref 0.2–1.2)
Total Protein: 6.2 g/dL (ref 6.1–8.1)

## 2016-05-13 MED ORDER — LOSARTAN POTASSIUM 25 MG PO TABS: 25.0000 mg | ORAL_TABLET | Freq: Every day | ORAL | 3 refills | Status: DC

## 2016-05-13 MED ORDER — METOPROLOL TARTRATE 25 MG PO TABS: 12.5000 mg | ORAL_TABLET | Freq: Three times a day (TID) | ORAL | 3 refills | Status: DC

## 2016-05-13 MED ORDER — EZETIMIBE 10 MG PO TABS: 10.0000 mg | ORAL_TABLET | Freq: Every day | ORAL | 3 refills | Status: DC

## 2016-05-13 NOTE — Assessment & Plan Note (Signed)
History of CAD status post cardiac catheterization performed 01/07/11 revealing left main/two-vessel disease with mild LV dysfunction. He also underwent coronary artery bypass grafting X 4 by Dr. Tharon Aquas Trigt with a LIMA to the LAD, vein to diagonal branch, obtuse marginal branch and PDA. His postoperative course was on top located. He recuperated well. Echo performed 04/03/11 was normal with mild septal hypokinesia and Myoview was normal as well. He denies chest pain or shortness of breath.

## 2016-05-13 NOTE — Assessment & Plan Note (Signed)
History of hypertension blood pressure measures 142/88. He is on losartan and metoprolol. Continue current meds at current dosing

## 2016-05-13 NOTE — Progress Notes (Signed)
05/13/2016 Derek Hayden   03-19-1951  [id number]  Primary Physician Tivis Ringer, MD Primary Cardiologist: Lorretta Harp MD FACP, [place], [place], [place]  HPI:  Derek Hayden is a 65 y/o  mildly overweight, divorced, Caucasian male father of 94, grandfather to 2 grandchildren who I last saw in the office 05/09/15. 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 He has a history of CAD status post cath Jan 07, 2011 revealing left main 2-vessel disease with mild LV dysfunction. He ultimately underwent coronary artery bypass grafting x4 by Dr. Tharon Aquas Trigt with a LIMA to his LAD, a vein to a diagonal branch, OM and PDA. His hospital course was uncomplicated, and he recuperated nicely. He had an echo performed April 03, 2011 that was normal with some mild septal hypokinesia and a Myoview that was normal as well. I saw him in the office 1 year ago . Since that time he's denied chest pain or shortness of breath. It has become evident however that he is statin intolerant. He exercises on a daily basis doing to 200 crunches in the morning and 100 pushups. He has also had his left ankle operated on since I saw him last and has recuperated nicely. He has bilateral rotator cuff tears and will need to have this addressed in the future as well.   Current Outpatient Prescriptions  Medication Sig Dispense Refill  . aspirin 162 MG EC tablet Take 162 mg by mouth daily.      . cholecalciferol (VITAMIN D) 1000 units tablet Take 1,000 Units by mouth daily.    . Coenzyme Q10 (COQ-10 PO) Take 1 tablet by mouth daily.    Marland Kitchen ezetimibe (ZETIA) 10 MG tablet Take 10 mg by mouth daily.    Marland Kitchen losartan (COZAAR) 25 MG tablet Take 1 tablet (25 mg total) by mouth daily. 90 tablet 3  . metoprolol tartrate (LOPRESSOR) 25 MG tablet Take 0.5 tablets (12.5 mg total) by mouth 3 (three) times daily. 135 tablet 3  . Multiple Vitamin (MULTIVITAMIN PO) Take 1 tablet by mouth daily.      .  rosuvastatin (CRESTOR) 5 MG tablet Take 5 mg by mouth daily.     No current facility-administered medications for this visit.     No Known Allergies  Social History   Social History  . Marital status: Single    Spouse name: N/A  . Number of children: N/A  . Years of education: N/A   Occupational History  . Not on file.   Social History Main Topics  . Smoking status: Never Smoker  . Smokeless tobacco: Never Used  . Alcohol use No  . Drug use: No  . Sexual activity: Not on file   Other Topics Concern  . Not on file   Social History Narrative  . No narrative on file     Review of Systems: General: negative for chills, fever, night sweats or weight changes.  Cardiovascular: negative for chest pain, dyspnea on exertion, edema, orthopnea, palpitations, paroxysmal nocturnal dyspnea or shortness of breath Dermatological: negative for rash Respiratory: negative for cough or wheezing Urologic: negative for hematuria Abdominal: negative for nausea, vomiting, diarrhea, bright red blood per rectum, melena, or hematemesis Neurologic: negative for visual changes, syncope, or dizziness All other systems reviewed and are otherwise negative except as noted above.    Blood pressure (!) 142/88, pulse 60, height 5\' 5"  (1.651 m), weight 193 lb 12.8 oz (87.9 kg).  General  appearance: alert and no distress Neck: no adenopathy, no carotid bruit, no JVD, supple, symmetrical, trachea midline and thyroid not enlarged, symmetric, no tenderness/mass/nodules Lungs: clear to auscultation bilaterally Heart: regular rate and rhythm, S1, S2 normal, no murmur, click, rub or gallop Extremities: extremities normal, atraumatic, no cyanosis or edema  EKG normal sinus rhythm at 60 ST or T-wave changes. I personally reviewed this EKG  ASSESSMENT AND PLAN:   S/P CABG x 4: Jan 08, 2011,  Dr. Prescott Gum, LIMA to the LAD, SVG to diag, SVG to OM, SVG to PDA.   History of CAD status post cardiac  catheterization performed 01/07/11 revealing left main/two-vessel disease with mild LV dysfunction. He also underwent coronary artery bypass grafting X 4 by Dr. Tharon Aquas Trigt with a LIMA to the LAD, vein to diagonal branch, obtuse marginal branch and PDA. His postoperative course was on top located. He recuperated well. Echo performed 04/03/11 was normal with mild septal hypokinesia and Myoview was normal as well. He denies chest pain or shortness of breath.  HTN (hypertension) History of hypertension blood pressure measures 142/88. He is on losartan and metoprolol. Continue current meds at current dosing  Hyperlipidemia History of hyperlipidemia and talk to statin drugs which she has not taken the last several months. We will recheck a fasting lipid profile this morning. If he is not at goal will refer him to [facility] for discussion regarding PCS K9 .      Lorretta Harp MD [facility], [employer] 05/13/2016 8:48 AM

## 2016-05-13 NOTE — Assessment & Plan Note (Signed)
History of hyperlipidemia and talk to statin drugs which she has not taken the last several months. We will recheck a fasting lipid profile this morning. If he is not at goal will refer him to [facility] for discussion regarding PCS K9 .

## 2016-05-13 NOTE — Patient Instructions (Signed)
Medication Instructions:  NO CHANGES.  Labwork: Your physician recommends that you return for lab work in: [place]. YOU WILL NEED TO BE FASTING.  Follow-Up: Your physician wants you to follow-up in: [place]. You will receive a reminder letter in the mail two months in advance. If you don't receive a letter, please call our office to schedule the follow-up appointment.  If you need a refill on your cardiac medications before your next appointment, please call your pharmacy.

## 2016-05-26 MED FILL — METOPROLOL TARTRATE 25 MG T: 25 | 90 days supply | Qty: 135 | Fill #0

## 2016-05-26 MED FILL — LOSARTAN POTASSIUM 25 MG TA: 25 | 90 days supply | Qty: 90 | Fill #0

## 2016-05-26 MED FILL — EZETIMIBE 10 MG TABLET: 10 | 90 days supply | Qty: 90 | Fill #0

## 2016-06-10 ENCOUNTER — Ambulatory Visit: Payer: Managed Care, Other (non HMO)

## 2016-06-10 NOTE — Progress Notes (Unsigned)
Patient ID: [name]                 DOB: 1951/07/13                    MRN: [id number]     HPI: Derek Hayden is a 65 y.o. male patient of *** with PMH below that presents today for lipid evaluation.  Cardiac Hx: CAD, s/p CABGx4 in 2012, HTN, HLD  Risk Factors: HTN LDL Goal: <70  Current Medications:  Zetia 10mg  daily Crestor 5mg  daily  Intolerances:   Diet:   Exercise:   Family History:   Social History:   Labs:  Past Medical History:  Diagnosis Date  . CAD (coronary artery disease)    had 3 vessels 100% blocked, 1 vessel 90%- had CABG in May  . Hyperlipidemia   . Hypertension     Current Outpatient Prescriptions on File Prior to Visit  Medication Sig Dispense Refill  . aspirin 162 MG EC tablet Take 162 mg by mouth daily.      . cholecalciferol (VITAMIN D) 1000 units tablet Take 1,000 Units by mouth daily.    . Coenzyme Q10 (COQ-10 PO) Take 1 tablet by mouth daily.    Marland Kitchen ezetimibe (ZETIA) 10 MG tablet Take 1 tablet (10 mg total) by mouth daily. 90 tablet 3  . losartan (COZAAR) 25 MG tablet Take 1 tablet (25 mg total) by mouth daily. 90 tablet 3  . metoprolol tartrate (LOPRESSOR) 25 MG tablet Take 0.5 tablets (12.5 mg total) by mouth 3 (three) times daily. 135 tablet 3  . Multiple Vitamin (MULTIVITAMIN PO) Take 1 tablet by mouth daily.      . rosuvastatin (CRESTOR) 5 MG tablet Take 5 mg by mouth daily.     No current facility-administered medications on file prior to visit.     No Known Allergies  Assessment/Plan:  Thank you,  Lelan Pons. Patterson Hammersmith, [facility]  06/10/2016 7:43 AM

## 2016-07-22 ENCOUNTER — Ambulatory Visit (INDEPENDENT_AMBULATORY_CARE_PROVIDER_SITE_OTHER): Payer: Managed Care, Other (non HMO) | Admitting: Pharmacist Clinician (PhC)/ Clinical Pharmacy Specialist

## 2016-07-22 DIAGNOSIS — E785 Hyperlipidemia, unspecified: Secondary | ICD-10-CM

## 2016-07-22 NOTE — Progress Notes (Signed)
07/23/2016 Derek Hayden 05-25-1951 [id number]   HPI:  Derek Hayden is a 65 y.o. male patient of Dr Gwenlyn Found, who presents today for a lipid clinic evaluation.  His cardiac history is significant for CABG x 4 in May of 2012.  For 2-3 years after that he tolerated atorvastatin 20 mg without problem, then in December 2016 developed myalgias and the medication had to be discontinued.  He was then challenged with rosuvastatin 20 mg and later 5 mg, but the myalgias persisted.  He does continue with ezetimibe 10 mg daily.  Unfortunately his LDL is not at goal with just that.    Current Medications:  Ezetimibe 10 mg daily Risk Factors:  CAD with CABG x 4 in May 2012  Cholesterol Goals:  LDL < 70   Intolerant/previously tried:  Atorvastatin 20 mg (04-2013 to 07-2015)  Rosuvastatin 20 mg (date unknown)  Rosuvastatin 5 mg (08-2015)  Family history:   Paternal grandfather and uncle both with CAD, 1 brother with hypertension  Social history:  To tobacco, occasional alcohol, no sodas, 1-2 cups of coffee per day  Diet:   Eats mostly chicken, some country steak; not as much fruits/vegetables as he should; does eat salads and drinks milk daily  Exercise:    Daily routine includes 200 crunches, flex stretching and 150 push ups daily Labs:   06-2016   TC 189, TG 73, HDL 42, LDL 132 (ezetimibe 10 mg daily)  04-2014   TC 123, TG 71, HDL 40, LDL 69 (atorvastatin 20 + ezetimibe 10)  Current Outpatient Prescriptions  Medication Sig Dispense Refill  . aspirin 162 MG EC tablet Take 162 mg by mouth daily.      . cholecalciferol (VITAMIN D) 1000 units tablet Take 1,000 Units by mouth daily.    . Coenzyme Q10 (COQ-10 PO) Take 1 tablet by mouth daily.    Marland Kitchen ezetimibe (ZETIA) 10 MG tablet Take 1 tablet (10 mg total) by mouth daily. 90 tablet 3  . losartan (COZAAR) 25 MG tablet Take 1 tablet (25 mg total) by mouth daily. 90 tablet 3  . metoprolol tartrate (LOPRESSOR) 25 MG tablet Take 0.5 tablets (12.5 mg  total) by mouth 3 (three) times daily. 135 tablet 3  . Multiple Vitamin (MULTIVITAMIN PO) Take 1 tablet by mouth daily.      . rosuvastatin (CRESTOR) 5 MG tablet Take 5 mg by mouth daily.     No current facility-administered medications for this visit.     No Known Allergies  Past Medical History:  Diagnosis Date  . CAD (coronary artery disease)    had 3 vessels 100% blocked, 1 vessel 90%- had CABG in May  . Hyperlipidemia   . Hypertension     There were no vitals taken for this visit.   Hyperlipidemia Patient with elevated LDL, unable to tolerate atorvastatin or rosuvastatin due to myalgias.  Patient continues with ezetimbie 10 mg daily.  Because of his history of CAD, will send in request to start patient on Repatha [id number] mg SureClick.     Tommy Medal PharmD CPP [facility]

## 2016-07-22 NOTE — Patient Instructions (Addendum)
Continue with your current medications  We will submit the Repatha information to your insurance company.  If you hear anything from your insurance company please let us know

## 2016-07-23 NOTE — Assessment & Plan Note (Signed)
Patient with elevated LDL, unable to tolerate atorvastatin or rosuvastatin due to myalgias.  Patient continues with ezetimbie 10 mg daily.  Because of his history of CAD, will send in request to start patient on Repatha [id number] mg SureClick.

## 2016-07-28 ENCOUNTER — Encounter: Payer: Self-pay | Admitting: Pharmacist Clinician (PhC)/ Clinical Pharmacy Specialist

## 2016-08-05 ENCOUNTER — Other Ambulatory Visit: Payer: Self-pay | Admitting: Pharmacist Clinician (PhC)/ Clinical Pharmacy Specialist

## 2016-08-05 MED ORDER — EVOLOCUMAB 140 MG/ML ~~LOC~~ SOAJ: 140.0000 mg | SUBCUTANEOUS | 11 refills | Status: DC

## 2016-08-19 ENCOUNTER — Telehealth: Payer: Self-pay | Admitting: Pharmacist

## 2016-08-19 MED FILL — EZETIMIBE 10 MG TABLET: 10 | 90 days supply | Qty: 90 | Fill #1

## 2016-08-19 MED FILL — LOSARTAN POTASSIUM 25 MG TA: 25 | 90 days supply | Qty: 90 | Fill #1

## 2016-08-19 MED FILL — METOPROLOL TARTRATE 25 MG T: 25 | 90 days supply | Qty: 135 | Fill #1

## 2016-08-19 NOTE — Telephone Encounter (Signed)
Called about instruction for Repatha injection. Verbal instructions given and advised that detailed instructions in package. Also went over common side effects (injection site reaction and flu like symptoms). Pt states he will start medication this afternoon. He will contact us if experiences side effects or if we need to order lipid panel (usually ordered by PCP). Pt states understanding and appreciation

## 2016-10-09 ENCOUNTER — Telehealth: Payer: Self-pay | Admitting: Pharmacist Clinician (PhC)/ Clinical Pharmacy Specialist

## 2016-10-09 DIAGNOSIS — E785 Hyperlipidemia, unspecified: Secondary | ICD-10-CM

## 2016-10-09 NOTE — Telephone Encounter (Signed)
LMOM, patient due for labs by first of March

## 2016-10-10 NOTE — Telephone Encounter (Signed)
Patient returned call after hours Thursday.  Has Monday and Tuesday off.  Returned call, LM on cell per patient request.  He can come by office Monday or Tuesday AM, pick up the lab orders and take to [facility] on first floor.  We should have results in 24-36 hours.

## 2016-10-13 ENCOUNTER — Other Ambulatory Visit: Payer: Self-pay | Admitting: Cardiovascular Disease

## 2016-10-13 LAB — LIPID PANEL
Cholesterol: 95 mg/dL (ref <200)
HDL: 47 mg/dL (ref >40)
LDL CALC: 33 mg/dL (ref <100)
TRIGLYCERIDES: 75 mg/dL (ref <150)
Total CHOL/HDL Ratio: 2 Ratio (ref <5.0)
VLDL: 15 mg/dL (ref <30)

## 2016-10-13 LAB — HEPATIC FUNCTION PANEL
ALK PHOS: 68 U/L (ref 40–115)
ALT: 15 U/L (ref 9–46)
AST: 19 U/L (ref 10–35)
Albumin: 3.9 g/dL (ref 3.6–5.1)
BILIRUBIN INDIRECT: 0.4 mg/dL (ref 0.2–1.2)
BILIRUBIN TOTAL: 0.5 mg/dL (ref 0.2–1.2)
Bilirubin, Direct: 0.1 mg/dL (ref <=0.2)
TOTAL PROTEIN: 6.1 g/dL (ref 6.1–8.1)

## 2016-10-14 ENCOUNTER — Encounter: Payer: Self-pay | Admitting: Cardiovascular Disease

## 2016-10-17 ENCOUNTER — Telehealth: Payer: Self-pay | Admitting: Cardiovascular Disease

## 2016-10-17 NOTE — Telephone Encounter (Signed)
Follow up ° ° ° ° ° °Returning a call to the nurse °

## 2016-10-17 NOTE — Telephone Encounter (Signed)
New message    Pt is calling about test results he had done on Monday. And he also wants to know about his new medication-repatha.

## 2016-10-17 NOTE — Telephone Encounter (Signed)
Spoke with pt, aware of cholesterol results. Will forward to pharmacist regarding repatha refill.

## 2016-10-17 NOTE — Telephone Encounter (Signed)
Left msg to call.

## 2016-10-17 NOTE — Telephone Encounter (Signed)
Talked to patient this morning. Was informed of his Blood work results by Therapist, sports already.  Notified Erasmo Downer (PharmD) also received result and will submit  paperwork for repatha renewal ASAP.   We will call back if needed.

## 2016-11-04 ENCOUNTER — Telehealth: Payer: Self-pay | Admitting: Cardiovascular Disease

## 2016-11-04 NOTE — Telephone Encounter (Signed)
Helene Kelp from [employer] is calling on behalf of patient, she was calling in regards to a prior authorization for patient medication repatha sureclick, 546 mg. Please call at [phone]. Thanks.

## 2016-11-05 NOTE — Telephone Encounter (Signed)
Paperwork faxed 11/05/16

## 2016-11-07 ENCOUNTER — Telehealth: Payer: Self-pay | Admitting: Cardiovascular Disease

## 2016-11-07 NOTE — Telephone Encounter (Signed)
Working on it. Please see previous notes.

## 2016-11-07 NOTE — Telephone Encounter (Signed)
Message routed to pharmacy staff

## 2016-11-07 NOTE — Telephone Encounter (Signed)
New Message     Prior Auth for Repatha has been processed and approved just waiting on approval to be put in computer so that they can give you information

## 2016-11-24 MED FILL — METOPROLOL TARTRATE 25 MG T: 25 | 90 days supply | Qty: 135 | Fill #2

## 2016-11-24 MED FILL — EZETIMIBE 10 MG TABLET: 10 | 90 days supply | Qty: 90 | Fill #2

## 2016-11-24 MED FILL — LOSARTAN POTASSIUM 25 MG TA: 25 | 90 days supply | Qty: 90 | Fill #2

## 2017-01-14 ENCOUNTER — Other Ambulatory Visit: Payer: Self-pay | Admitting: Dermatology

## 2017-01-14 DIAGNOSIS — C4491 Basal cell carcinoma of skin, unspecified: Secondary | ICD-10-CM

## 2017-01-14 DIAGNOSIS — C4499 Other specified malignant neoplasm of skin, unspecified: Secondary | ICD-10-CM

## 2017-01-14 HISTORY — DX: Basal cell carcinoma of skin, unspecified: C44.91

## 2017-01-14 HISTORY — DX: Other specified malignant neoplasm of skin, unspecified: C44.99

## 2017-02-20 MED FILL — METOPROLOL TARTRATE 25 MG T: 25 | 30 days supply | Qty: 45 | Fill #3

## 2017-02-20 MED FILL — EZETIMIBE 10 MG TABLET: 10 | 30 days supply | Qty: 30 | Fill #3

## 2017-02-20 MED FILL — LOSARTAN POTASSIUM 25 MG TA: 25 | 30 days supply | Qty: 30 | Fill #3

## 2017-02-23 ENCOUNTER — Other Ambulatory Visit: Payer: Self-pay | Admitting: *Deleted

## 2017-02-23 ENCOUNTER — Other Ambulatory Visit: Payer: Self-pay

## 2017-02-23 MED ORDER — LOSARTAN POTASSIUM 25 MG PO TABS: 25.0000 mg | ORAL_TABLET | Freq: Every day | ORAL | 0 refills | Status: DC

## 2017-02-23 MED ORDER — EZETIMIBE 10 MG PO TABS: 10.0000 mg | ORAL_TABLET | Freq: Every day | ORAL | 0 refills | Status: DC

## 2017-02-23 MED ORDER — METOPROLOL TARTRATE 25 MG PO TABS: 12.5000 mg | ORAL_TABLET | Freq: Three times a day (TID) | ORAL | 1 refills | Status: DC

## 2017-02-26 ENCOUNTER — Other Ambulatory Visit: Payer: Self-pay | Admitting: Cardiovascular Disease

## 2017-02-26 MED ORDER — LOSARTAN POTASSIUM 25 MG PO TABS: 25.0000 mg | ORAL_TABLET | Freq: Every day | ORAL | 0 refills | Status: DC

## 2017-02-26 MED ORDER — EZETIMIBE 10 MG PO TABS: 10.0000 mg | ORAL_TABLET | Freq: Every day | ORAL | 0 refills | Status: DC

## 2017-02-26 NOTE — Telephone Encounter (Signed)
Three prescriptions request were sent in last week,only one was received.Pt still need the Losartan 25 mg and the Ezetimibe 10 mg.She also needs the clarification on the Zetia ,since patient is already on Repatha.

## 2017-04-30 ENCOUNTER — Telehealth: Payer: Self-pay | Admitting: Cardiovascular Disease

## 2017-04-30 NOTE — Telephone Encounter (Signed)
Requesting surgical clearance:  1. Type of surgery: Rotator Cuff Repair  2. Surgeon: Dr. Susa Day  3.Surgical Date:TBD  4. Medications that need to be held: ASA 81--if necessary   5. CAD: Yes  6. I will defer to:  Dr. Pearla Dubonnet Information:  [facility] Fax:  [phone]

## 2017-05-01 NOTE — Telephone Encounter (Signed)
Pt calling to see the status of the papers he dropped off on Tuesday for his surgery.

## 2017-05-02 ENCOUNTER — Other Ambulatory Visit: Payer: Self-pay | Admitting: Cardiovascular Disease

## 2017-05-03 NOTE — Telephone Encounter (Signed)
He can interrupt anti platelet Rx for ortho procedure

## 2017-05-18 NOTE — Telephone Encounter (Signed)
Clearance routed to number provided via EPIC. 

## 2017-05-20 ENCOUNTER — Ambulatory Visit: Payer: Self-pay | Admitting: Orthopedic Surgery

## 2017-05-27 ENCOUNTER — Ambulatory Visit: Payer: Self-pay | Admitting: Orthopedic Surgery
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
## 2017-05-29 ENCOUNTER — Encounter: Payer: Self-pay | Admitting: Cardiovascular Disease

## 2017-05-29 ENCOUNTER — Ambulatory Visit (INDEPENDENT_AMBULATORY_CARE_PROVIDER_SITE_OTHER): Payer: Managed Care, Other (non HMO) | Admitting: Cardiovascular Disease

## 2017-05-29 VITALS — BP 134/79 | HR 74 | Ht 65.0 in | Wt 191.8 lb

## 2017-05-29 DIAGNOSIS — I10 Essential (primary) hypertension: Secondary | ICD-10-CM

## 2017-05-29 DIAGNOSIS — Z951 Presence of aortocoronary bypass graft: Secondary | ICD-10-CM | POA: Diagnosis not present

## 2017-05-29 DIAGNOSIS — E785 Hyperlipidemia, unspecified: Secondary | ICD-10-CM | POA: Diagnosis not present

## 2017-05-29 NOTE — Assessment & Plan Note (Signed)
History of hyperlipidemia intolerant to statin therapy. He is on Zetia and Repatha . His most recent lipid profile performed 10/13/16 revealed a total cholesterol of 95 which is down from 174 and a LDL of 33 down from 110. Based on this, I suggested that he discontinue his Zetia and we'll we will recheck a lipid liver profile in 3 months.Marland Kitchen

## 2017-05-29 NOTE — Patient Instructions (Signed)
Medication Instructions: Your physician recommends that you continue on your current medications as directed. Please refer to the Current Medication list given to you today.  OK to stop Zetia.  Labwork: Your physician recommends that you return for a FASTING lipid profile and hepatic function panel in 2-3 months.   Follow-Up: Your physician wants you to follow-up in: 1 year with Dr. Gwenlyn Found. You will receive a reminder letter in the mail two months in advance. If you don't receive a letter, please call our office to schedule the follow-up appointment.  If you need a refill on your cardiac medications before your next appointment, please call your pharmacy.

## 2017-05-29 NOTE — Assessment & Plan Note (Signed)
History of essential hypertension blood pressure measured 134/79. He is on losartan and metoprolol. Continue current meds at current dosing

## 2017-05-29 NOTE — Assessment & Plan Note (Signed)
History of CAD status post coronary artery bypass grafting 01/07/11 after cath revealed left main/two-vessel disease with mild LV dysfunction. He had bypass grafting 4 by Dr. Tharon Aquas Trigt with a  LIMA to his LAD, vein to diagonal branch, obtuse marginal branch and PDA. Postoperative course was uncomplicated. Echo performed 04/03/11 was normal with some mild septal hypokinesia and Myoview was normal as well. He denies chest pain or shortness of breath.

## 2017-05-29 NOTE — Progress Notes (Signed)
05/29/2017 Derek Hayden   01/12/1951  [id number]  Primary Physician Prince Solian, MD Primary Cardiologist: Lorretta Harp MD Lupe Carney, [place]  HPI:  Derek Hayden is a 66 y.o. male mildly overweight, divorced, Caucasian male father of 25, grandfather to 2 grandchildren who I last saw in the office 05/13/16. He is currently working [employer] [facility] [employer] for [employer]. He has a history of CAD status post cath Jan 07, 2011 revealing left main 2-vessel disease with mild LV dysfunction. He ultimately underwent coronary artery bypass grafting x4 by Dr. Tharon Aquas Trigt with a LIMA to his LAD, a vein to a diagonal branch, OM and PDA. His hospital course was uncomplicated, and he recuperated nicely. He had an echo performed April 03, 2011 that was normal with some mild septal hypokinesia and a Myoview that was normal as well.  He exercises on a daily basis doing to 200 crunches in the morning and 100 pushups. He has also had his left ankle operated on since I saw him last and has recuperated nicely. He has bilateral rotator cuff tears and will need to have this addressed in the future as well. Since I saw him in the office a year ago he is completely asymptomatic. He is statin intolerant and was begun on Repathawith an excellent clinical result. LDL fell from 110-33.   Current Meds  Medication Sig  . aspirin 162 MG EC tablet Take 162 mg by mouth daily.    . cholecalciferol (VITAMIN D) 1000 units tablet Take 1,000 Units by mouth daily.  . Coenzyme Q10 (COQ-10 PO) Take 1 tablet by mouth daily.  . Evolocumab (REPATHA SURECLICK) 409 MG/ML SOAJ Inject 140 mg into the skin every 14 (fourteen) days.  Marland Kitchen losartan (COZAAR) 25 MG tablet Take 1 tablet (25 mg total) by mouth daily. PLEASE MAKE APPOINTMENT FOR FURTHER REFILLS  . losartan (COZAAR) 25 MG tablet TAKE 1 TABLET BY MOUTH DAILY - NEEDS TO MAKE APPOINTMENT FOR FUTURE REFILLS  . metoprolol tartrate (LOPRESSOR) 25 MG  tablet Take 0.5 tablets (12.5 mg total) by mouth 3 (three) times daily.  . Multiple Vitamin (MULTIVITAMIN PO) Take 1 tablet by mouth daily.    . rosuvastatin (CRESTOR) 5 MG tablet Take 5 mg by mouth daily.  . [DISCONTINUED] ezetimibe (ZETIA) 10 MG tablet Take 1 tablet (10 mg total) by mouth daily.  . [DISCONTINUED] ezetimibe (ZETIA) 10 MG tablet TAKE 1 TABLET BY MOUTH DAILY     No Known Allergies  Social History   Social History  . Marital status: Single    Spouse name: N/A  . Number of children: N/A  . Years of education: N/A   Occupational History  . Not on file.   Social History Main Topics  . Smoking status: Never Smoker  . Smokeless tobacco: Never Used  . Alcohol use No  . Drug use: No  . Sexual activity: Not on file   Other Topics Concern  . Not on file   Social History Narrative  . No narrative on file     Review of Systems: General: negative for chills, fever, night sweats or weight changes.  Cardiovascular: negative for chest pain, dyspnea on exertion, edema, orthopnea, palpitations, paroxysmal nocturnal dyspnea or shortness of breath Dermatological: negative for rash Respiratory: negative for cough or wheezing Urologic: negative for hematuria Abdominal: negative for nausea, vomiting, diarrhea, bright red blood per rectum, melena, or hematemesis Neurologic: negative for visual changes, syncope, or dizziness  All other systems reviewed and are otherwise negative except as noted above.    Blood pressure 134/79, pulse 74, height 5\' 5"  (1.651 m), weight 191 lb 12.8 oz (87 kg).  General appearance: alert and no distress Neck: no adenopathy, no carotid bruit, no JVD, supple, symmetrical, trachea midline and thyroid not enlarged, symmetric, no tenderness/mass/nodules Lungs: clear to auscultation bilaterally Heart: regular rate and rhythm, S1, S2 normal, no murmur, click, rub or gallop Extremities: extremities normal, atraumatic, no cyanosis or edema Pulses: 2+ and  symmetric Skin: Skin color, texture, turgor normal. No rashes or lesions Neurologic: Alert and oriented X 3, normal strength and tone. Normal symmetric reflexes. Normal coordination and gait  EKG sinus rhythm at 74 with small inferior Q waves and early R-wave transition. I personally reviewed this EKG.  ASSESSMENT AND PLAN:   S/P CABG x 4: Jan 08, 2011,  Dr. Prescott Gum, LIMA to the LAD, SVG to diag, SVG to OM, SVG to PDA.   History of CAD status post coronary artery bypass grafting 01/07/11 after cath revealed left main/two-vessel disease with mild LV dysfunction. He had bypass grafting 4 by Dr. Tharon Aquas Trigt with a  LIMA to his LAD, vein to diagonal branch, obtuse marginal branch and PDA. Postoperative course was uncomplicated. Echo performed 04/03/11 was normal with some mild septal hypokinesia and Myoview was normal as well. He denies chest pain or shortness of breath.  HTN (hypertension) History of essential hypertension blood pressure measured 134/79. He is on losartan and metoprolol. Continue current meds at current dosing  Hyperlipidemia History of hyperlipidemia intolerant to statin therapy. He is on Zetia and Repatha . His most recent lipid profile performed 10/13/16 revealed a total cholesterol of 95 which is down from 174 and a LDL of 33 down from 110. Based on this, I suggested that he discontinue his Zetia and we'll we will recheck a lipid liver profile in 3 months.Lorretta Harp MD FACP,FACC,FAHA, [facility] 05/29/2017 4:48 PM

## 2017-06-08 NOTE — Addendum Note (Signed)
Addended by: Leland Johns A on: 06/08/2017 08:12 AM   Modules accepted: Orders

## 2017-06-09 ENCOUNTER — Inpatient Hospital Stay (HOSPITAL_COMMUNITY): Admission: RE | Admit: 2017-06-09 | Payer: Self-pay | Source: Ambulatory Visit

## 2017-06-18 ENCOUNTER — Encounter (HOSPITAL_COMMUNITY): Admission: RE | Payer: Self-pay | Source: Ambulatory Visit

## 2017-06-18 ENCOUNTER — Ambulatory Visit (HOSPITAL_COMMUNITY)
Admission: RE | Admit: 2017-06-18 | Payer: Managed Care, Other (non HMO) | Source: Ambulatory Visit | Admitting: Specialist

## 2017-06-18 SURGERY — SHOULDER ARTHROSCOPY WITH ROTATOR CUFF REPAIR AND SUBACROMIAL DECOMPRESSION
Anesthesia: Choice | Laterality: Left

## 2017-07-07 ENCOUNTER — Other Ambulatory Visit: Payer: Self-pay | Admitting: Cardiovascular Disease

## 2017-07-08 MED FILL — AMOX TR-K CLV 875-125 MG TA: 875-125 | 7 days supply | Qty: 14 | Fill #0

## 2017-07-08 MED FILL — HYDROCODONE-HOMATROPINE SYR: 5-1.5 | 6 days supply | Qty: 120 | Fill #0

## 2017-07-10 ENCOUNTER — Other Ambulatory Visit: Payer: Self-pay | Admitting: Cardiovascular Disease

## 2017-07-23 ENCOUNTER — Other Ambulatory Visit: Payer: Self-pay | Admitting: Cardiovascular Disease

## 2017-07-23 ENCOUNTER — Telehealth: Payer: Self-pay | Admitting: Cardiovascular Disease

## 2017-07-23 MED ORDER — EZETIMIBE 10 MG PO TABS: 10.0000 mg | ORAL_TABLET | Freq: Every day | ORAL | 3 refills | Status: DC

## 2017-07-23 NOTE — Telephone Encounter (Signed)
Patient LDL increased from 33 (on Repatha and ezetimibe) to 65 (on Repatha alone).   He would like to go back on ezetimibe to keep the LDL lower.   Will renew that for him today.   Explained the change in pricing on [place], hopefully by the time he goes on Medicare in April the price will not be cost prohibitive.

## 2017-07-23 NOTE — Telephone Encounter (Signed)
New Message     Patient would like to know what supplemental insurance is the best for coverage for Repatha,  He is shopping for new insurance and wants to know what you think.   He also went for lab work on Monday and he would like you to look at that as well.

## 2017-07-27 ENCOUNTER — Other Ambulatory Visit: Payer: Self-pay | Admitting: *Deleted

## 2017-07-27 MED ORDER — EZETIMIBE 10 MG PO TABS: 10.0000 mg | ORAL_TABLET | Freq: Every day | ORAL | 3 refills | Status: DC

## 2017-07-27 NOTE — Telephone Encounter (Signed)
Patient called and stated that [employer] home delivery never received the order for zetia. He also states that Christella Scheuermann has attempted to reach the office with no success. Thanks, MI

## 2017-07-29 ENCOUNTER — Other Ambulatory Visit: Payer: Self-pay

## 2017-07-29 MED ORDER — EZETIMIBE 10 MG PO TABS: 10.0000 mg | ORAL_TABLET | Freq: Every day | ORAL | 3 refills | Status: DC

## 2017-08-07 ENCOUNTER — Other Ambulatory Visit: Payer: Self-pay | Admitting: *Deleted

## 2017-08-07 MED ORDER — EZETIMIBE 10 MG PO TABS: 10.0000 mg | ORAL_TABLET | Freq: Every day | ORAL | 3 refills | Status: DC

## 2017-11-25 ENCOUNTER — Other Ambulatory Visit: Payer: Self-pay | Admitting: Dermatology

## 2017-11-25 DIAGNOSIS — C4491 Basal cell carcinoma of skin, unspecified: Secondary | ICD-10-CM

## 2017-11-25 HISTORY — DX: Basal cell carcinoma of skin, unspecified: C44.91

## 2017-11-26 MED FILL — TRIAMCINOLONE ACETONIDE 0.1: 0.1 | 30 days supply | Qty: 454 | Fill #0

## 2017-12-09 ENCOUNTER — Other Ambulatory Visit: Payer: Self-pay | Admitting: Pharmacist Clinician (PhC)/ Clinical Pharmacy Specialist

## 2017-12-09 MED ORDER — EVOLOCUMAB 140 MG/ML ~~LOC~~ SOAJ: 140.0000 mg | SUBCUTANEOUS | 3 refills | Status: DC

## 2018-01-08 MED FILL — MUPIROCIN 2% OINTMENT: 2 | 10 days supply | Qty: 22 | Fill #0

## 2018-02-09 MED FILL — predniSONE 10 MG TABS: 10 | 12 days supply | Qty: 21 | Fill #0

## 2018-03-02 MED FILL — TRIAMCINOLONE 0.1% CREAM: 0.1 | 30 days supply | Qty: 454 | Fill #1

## 2018-04-23 ENCOUNTER — Other Ambulatory Visit: Payer: Self-pay | Admitting: Cardiovascular Disease

## 2018-04-27 NOTE — Telephone Encounter (Signed)
Rx sent to pharmacy   

## 2018-05-06 ENCOUNTER — Other Ambulatory Visit: Payer: Self-pay | Admitting: *Deleted

## 2018-05-07 MED ORDER — LOSARTAN POTASSIUM 25 MG PO TABS: ORAL_TABLET | ORAL | 0 refills | Status: DC

## 2018-05-11 ENCOUNTER — Other Ambulatory Visit: Payer: Self-pay | Admitting: Pharmacist

## 2018-05-11 MED ORDER — EVOLOCUMAB 140 MG/ML ~~LOC~~ SOAJ: 140.0000 mg | SUBCUTANEOUS | 3 refills | Status: DC

## 2018-05-11 MED FILL — REPATHA 140 MG/1 ML INJ: 140 | 28 days supply | Qty: 2 | Fill #0

## 2018-05-25 ENCOUNTER — Telehealth: Payer: Self-pay | Admitting: Cardiovascular Disease

## 2018-05-25 DIAGNOSIS — E785 Hyperlipidemia, unspecified: Secondary | ICD-10-CM

## 2018-05-25 NOTE — Telephone Encounter (Signed)
Follow Up:     Checking to see if you received prior authorization for pt's Repatha? It was faxed on 05-11-18.

## 2018-05-26 NOTE — Telephone Encounter (Signed)
PA good to 11/2018 for Repatha

## 2018-05-28 NOTE — Telephone Encounter (Signed)
Patient stopped taking zetia few months ago due to back pain. Will stop by the office next week (Oct/7 or Oct/8) to repeat lipid panel.   Order in epic.

## 2018-05-28 NOTE — Addendum Note (Signed)
Addended by: Harrington Challenger on: 05/28/2018 12:45 PM   Modules accepted: Orders

## 2018-06-01 LAB — HEPATIC FUNCTION PANEL
ALT: 16 IU/L (ref 0–44)
AST: 24 IU/L (ref 0–40)
Albumin: 4.4 g/dL (ref 3.6–4.8)
Alkaline Phosphatase: 81 IU/L (ref 39–117)
BILIRUBIN TOTAL: 0.4 mg/dL (ref 0.0–1.2)
BILIRUBIN, DIRECT: 0.16 mg/dL (ref 0.00–0.40)
Total Protein: 6.5 g/dL (ref 6.0–8.5)

## 2018-06-01 LAB — LIPID PANEL
CHOLESTEROL TOTAL: 117 mg/dL (ref 100–199)
Chol/HDL Ratio: 2.3 ratio (ref 0.0–5.0)
HDL: 52 mg/dL (ref >39)
LDL Calculated: 53 mg/dL (ref 0–99)
Triglycerides: 62 mg/dL (ref 0–149)
VLDL Cholesterol Cal: 12 mg/dL (ref 5–40)

## 2018-06-02 ENCOUNTER — Encounter: Payer: Self-pay | Admitting: *Deleted

## 2018-06-11 MED FILL — REPATHA 140 MG/1 ML INJ: 140 | 28 days supply | Qty: 2 | Fill #1

## 2018-06-22 ENCOUNTER — Encounter: Payer: Self-pay | Admitting: Cardiovascular Disease

## 2018-06-22 ENCOUNTER — Ambulatory Visit: Payer: Managed Care, Other (non HMO) | Admitting: Cardiovascular Disease

## 2018-06-22 VITALS — BP 128/80 | HR 72 | Wt 195.0 lb

## 2018-06-22 DIAGNOSIS — I739 Peripheral vascular disease, unspecified: Secondary | ICD-10-CM | POA: Diagnosis not present

## 2018-06-22 DIAGNOSIS — E7800 Pure hypercholesterolemia, unspecified: Secondary | ICD-10-CM

## 2018-06-22 DIAGNOSIS — Z951 Presence of aortocoronary bypass graft: Secondary | ICD-10-CM | POA: Diagnosis not present

## 2018-06-22 NOTE — Assessment & Plan Note (Signed)
History of hyperlipidemia on Repatha with recent lipid profile performed 05/31/2018 revealing a total cholesterol 117, LDL 53 and HDL of 52.

## 2018-06-22 NOTE — Assessment & Plan Note (Signed)
History of essential hypertension her blood pressure measured at 120/80.  He is on losartan and metoprolol.  Continue current meds at current dosing

## 2018-06-22 NOTE — Progress Notes (Signed)
06/22/2018 Derek Hayden   1951/08/17  [id number]  Primary Physician Derek Solian, MD Primary Cardiologist: Derek Harp MD Derek Hayden, [place]  HPI:  Derek Hayden is a 67 y.o.  mildly overweight, divorced, Caucasian male father of 22, grandfather to 2 grandchildren who I last saw in the office  05/29/2017. 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 He has a history of CAD status post cath Jan 07, 2011 revealing left main 2-vessel disease with mild LV dysfunction. He ultimately underwent coronary artery bypass grafting x4 by Dr. Tharon Aquas Hayden with a LIMA to his LAD, a vein to a diagonal branch, OM and PDA. His hospital course was uncomplicated, and he recuperated nicely. He had an echo performed April 03, 2011 that was normal with some mild septal hypokinesia and a Myoview that was normal as well.  He exercises on a daily basis doing to 200 crunches in the morning and 150 pushups.  He also does a total body workout 3 days a week.  He has also had his left ankle operated on since I saw him last and has recuperated nicely. He has bilateral rotator cuff tears and will need to have this addressed in the future as well. Since I saw him in the office a year ago he is completely asymptomatic. He is statin intolerant and was begun on Repatha with an excellent clinical result. LDL fell from 110-53.    Current Meds  Medication Sig  . aspirin 162 MG EC tablet Take 162 mg by mouth daily.    . cholecalciferol (VITAMIN D) 1000 units tablet Take 1,000 Units by mouth daily.  . Coenzyme Q10 (CO Q 10 PO) Take 1 tablet by mouth daily.  . Evolocumab (REPATHA SURECLICK) 947 MG/ML SOAJ Inject 140 mg into the skin every 14 (fourteen) days.  Marland Kitchen losartan (COZAAR) 25 MG tablet Take 1 tablet (25 mg total) by mouth daily. PLEASE MAKE APPOINTMENT FOR FURTHER REFILLS (Patient taking differently: Take 25 mg by mouth daily. )  . losartan (COZAAR) 25 MG tablet TAKE 1  TABLET BY MOUTH EVERY DAY  . losartan (COZAAR) 25 MG tablet Take one (1) tablet by mouth daily. SCHEDULE OV FOR REFILLS.  Marland Kitchen metoprolol tartrate (LOPRESSOR) 25 MG tablet TAKE ONE-HALF (1/2) TABLET BY MOUTH THREE TIMES DAILY  . Multiple Vitamin (MULTIVITAMIN PO) Take 1 tablet by mouth daily.       No Known Allergies  Social History   Socioeconomic History  . Marital status: Single    Spouse name: Not on file  . Number of children: Not on file  . Years of education: Not on file  . Highest education level: Not on file  Occupational History  . Not on file  Social Needs  . Financial resource strain: Not on file  . Food insecurity:    Worry: Not on file    Inability: Not on file  . Transportation needs:    Medical: Not on file    Non-medical: Not on file  Tobacco Use  . Smoking status: Never Smoker  . Smokeless tobacco: Never Used  Substance and Sexual Activity  . Alcohol use: No  . Drug use: No  . Sexual activity: Not on file  Lifestyle  . Physical activity:    Days per week: Not on file    Minutes per session: Not on file  . Stress: Not on file  Relationships  . Social connections:  Talks on phone: Not on file    Gets together: Not on file    Attends religious service: Not on file    Active member of club or organization: Not on file    Attends meetings of clubs or organizations: Not on file    Relationship status: Not on file  . Intimate partner violence:    Fear of current or ex partner: Not on file    Emotionally abused: Not on file    Physically abused: Not on file    Forced sexual activity: Not on file  Other Topics Concern  . Not on file  Social History Narrative  . Not on file     Review of Systems: General: negative for chills, fever, night sweats or weight changes.  Cardiovascular: negative for chest pain, dyspnea on exertion, edema, orthopnea, palpitations, paroxysmal nocturnal dyspnea or shortness of breath Dermatological: negative for  rash Respiratory: negative for cough or wheezing Urologic: negative for hematuria Abdominal: negative for nausea, vomiting, diarrhea, bright red blood per rectum, melena, or hematemesis Neurologic: negative for visual changes, syncope, or dizziness All other systems reviewed and are otherwise negative except as noted above.    Blood pressure 128/80, pulse 72, weight 195 lb (88.5 kg).  General appearance: alert and no distress Neck: no adenopathy, no carotid bruit, no JVD, supple, symmetrical, trachea midline and thyroid not enlarged, symmetric, no tenderness/mass/nodules Lungs: clear to auscultation bilaterally Heart: regular rate and rhythm, S1, S2 normal, no murmur, click, rub or gallop Extremities: extremities normal, atraumatic, no cyanosis or edema Pulses: 2+ and symmetric Skin: Skin color, texture, turgor normal. No rashes or lesions Neurologic: Alert and oriented X 3, normal strength and tone. Normal symmetric reflexes. 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   History of CAD status post cath 01/07/2011 revealing left main 2 vessel disease with mild LV dysfunction.  He also underwent coronary artery bypass grafting x4 by Dr. Tharon Aquas Hayden  with a LIMA to his LAD, vein to a diagonal branch, OM and PDA.  He is done well since and is completely asymptomatic.  HTN (hypertension) History of essential hypertension her blood pressure measured at 120/80.  He is on losartan and metoprolol.  Continue current meds at current dosing  Hyperlipidemia History of hyperlipidemia on Repatha with recent lipid profile performed 05/31/2018 revealing a total cholesterol 117, LDL 53 and HDL of 52.      Derek Harp MD FACP,FACC,FAHA, Derek Hayden 06/22/2018 4:30 PM

## 2018-06-22 NOTE — Patient Instructions (Signed)
Medication Instructions:  Your physician recommends that you continue on your current medications as directed. Please refer to the Current Medication list given to you today.  If you need a refill on your cardiac medications before your next appointment, please call your pharmacy.   Lab work: none If you have labs (blood work) drawn today and your tests are completely normal, you will receive your results only by: Marland Kitchen MyChart Message (if you have MyChart) OR . A paper copy in the mail If you have any lab test that is abnormal or we need to change your treatment, we will call you to review the results.  Testing/Procedures: Your physician has requested that you have a lower extremity arterial doppler- During this test, ultrasound is used to evaluate arterial blood flow in the legs. Allow approximately one hour for this exam.  Your physician has requested that you have an ankle brachial index (ABI). During this test an ultrasound and blood pressure cuff are used to evaluate the arteries that supply the arms and legs with blood. Allow thirty minutes for this exam. There are no restrictions or special instructions.    Follow-Up: At [facility], you and your health needs are our priority.  As part of our continuing mission to provide you with exceptional heart care, we have created designated Provider Care Teams.  These Care Teams include your primary Cardiologist (physician) and Advanced Practice Providers (APPs -  Physician Assistants and Nurse Practitioners) who all work together to provide you with the care you need, when you need it. You will need a follow up appointment in 12 months.  Please call our office 2 months in advance to schedule this appointment.  You may see  Dr. Gwenlyn Found or one of the following Advanced Practice Providers on your designated Care Team:   Kerin Ransom, PA-C Roby Lofts, [place] . Sande Rives, PA-C  Any Other Special Instructions Will Be Listed Below (If  Applicable).

## 2018-06-22 NOTE — Assessment & Plan Note (Signed)
History of CAD status post cath 01/07/2011 revealing left main 2 vessel disease with mild LV dysfunction.  He also underwent coronary artery bypass grafting x4 by Dr. Tharon Aquas Trigt  with a LIMA to his LAD, vein to a diagonal branch, OM and PDA.  He is done well since and is completely asymptomatic.

## 2018-06-23 ENCOUNTER — Other Ambulatory Visit: Payer: Self-pay | Admitting: Cardiovascular Disease

## 2018-06-24 ENCOUNTER — Other Ambulatory Visit: Payer: Self-pay | Admitting: Cardiovascular Disease

## 2018-06-24 NOTE — Telephone Encounter (Signed)
Rx has been sent to the pharmacy electronically. ° °

## 2018-06-25 NOTE — Telephone Encounter (Signed)
Rx request sent to pharmacy.  

## 2018-07-09 MED FILL — REPATHA 140 MG/1 ML INJ: 140 | 28 days supply | Qty: 2 | Fill #2

## 2018-07-19 ENCOUNTER — Ambulatory Visit (HOSPITAL_COMMUNITY)
Admission: RE | Admit: 2018-07-19 | Discharge: 2018-07-19 | Disposition: A | Discharge status: Home / Self Care | Payer: Managed Care, Other (non HMO) | Source: Ambulatory Visit | Attending: Cardiovascular Disease | Admitting: Cardiovascular Disease

## 2018-07-19 ENCOUNTER — Encounter (HOSPITAL_COMMUNITY): Payer: Self-pay

## 2018-07-19 DIAGNOSIS — I739 Peripheral vascular disease, unspecified: Secondary | ICD-10-CM

## 2018-07-21 ENCOUNTER — Telehealth: Payer: Self-pay | Admitting: Cardiovascular Disease

## 2018-07-21 NOTE — Telephone Encounter (Signed)
New Message:   Patient calling about some results. Please call patient

## 2018-07-21 NOTE — Telephone Encounter (Signed)
Returned call to patient-results given.   Patient verbalized understanding.

## 2018-08-09 MED FILL — REPATHA 140 MG/1 ML INJ: 140 | 28 days supply | Qty: 2 | Fill #3

## 2018-09-03 MED FILL — REPATHA 140 MG/1 ML INJ: 140 | 28 days supply | Qty: 2 | Fill #4

## 2018-10-01 MED FILL — REPATHA SURECLICK 140 MG/ML: 140 | 28 days supply | Qty: 2 | Fill #5

## 2018-10-20 ENCOUNTER — Telehealth: Payer: Self-pay

## 2018-10-20 NOTE — Telephone Encounter (Signed)
Called pt left a detailed msg regarding scheduling labs for repatha instructed the pt to call us back in the office

## 2018-10-25 ENCOUNTER — Telehealth: Payer: Self-pay

## 2018-10-25 ENCOUNTER — Other Ambulatory Visit: Payer: Self-pay

## 2018-10-25 DIAGNOSIS — E785 Hyperlipidemia, unspecified: Secondary | ICD-10-CM

## 2018-10-25 NOTE — Telephone Encounter (Signed)
Called pt and left a detailed msg regarding needing labs for repatha pa asap gave detailed instructions

## 2018-10-27 LAB — LIPID PANEL
CHOL/HDL RATIO: 2.5 ratio (ref 0.0–5.0)
CHOLESTEROL TOTAL: 108 mg/dL (ref 100–199)
HDL: 44 mg/dL (ref >39)
LDL CALC: 54 mg/dL (ref 0–99)
TRIGLYCERIDES: 52 mg/dL (ref 0–149)
VLDL Cholesterol Cal: 10 mg/dL (ref 5–40)

## 2018-10-28 ENCOUNTER — Encounter: Payer: Self-pay | Admitting: Internal Medicine

## 2018-10-29 ENCOUNTER — Encounter: Payer: Self-pay | Admitting: *Deleted

## 2018-10-29 MED FILL — REPATHA SURECLICK 140 MG/ML: 140 | 28 days supply | Qty: 2 | Fill #6

## 2018-11-03 ENCOUNTER — Telehealth: Payer: Self-pay

## 2018-11-03 NOTE — Telephone Encounter (Signed)
Called and left a detailed msg stating that the pharmacy was faxed the letter of approval that is good until 11/27/18 and that a new pa cannot be completed until that one expires because I have tried to submit and cover my meds says that authorization is already on file for this request

## 2018-11-22 ENCOUNTER — Other Ambulatory Visit: Payer: Self-pay | Admitting: Dermatology

## 2018-11-22 DIAGNOSIS — D229 Melanocytic nevi, unspecified: Secondary | ICD-10-CM

## 2018-11-22 HISTORY — DX: Melanocytic nevi, unspecified: D22.9

## 2018-11-26 ENCOUNTER — Other Ambulatory Visit: Payer: Self-pay | Admitting: Cardiovascular Disease

## 2018-11-26 ENCOUNTER — Telehealth: Payer: Self-pay | Admitting: Cardiovascular Disease

## 2018-11-26 MED FILL — REPATHA SURECLICK 140 MG/ML: 140 | 28 days supply | Qty: 1 | Fill #0

## 2018-11-26 NOTE — Telephone Encounter (Signed)
F/U Message          Pharmacy calling about "Repatha" [phone]-(Jennifer)Phamacist states that if the PA is complete that's good however: the patient is out of refills. Pls call the pharmacy to advise.

## 2018-11-26 NOTE — Telephone Encounter (Signed)
Refill already sent in today and PA on file is good through tomorrow so rx should be able to be processed today. Will renew PA as well.

## 2018-12-08 NOTE — Telephone Encounter (Signed)
Repatha available without PA.

## 2018-12-17 ENCOUNTER — Encounter: Payer: Self-pay | Admitting: Internal Medicine

## 2018-12-27 ENCOUNTER — Telehealth: Payer: Self-pay

## 2018-12-27 MED ORDER — EVOLOCUMAB 140 MG/ML ~~LOC~~ SOAJ: 140.0000 mg | SUBCUTANEOUS | 11 refills | Status: DC

## 2018-12-27 MED FILL — REPATHA SURECLICK 140 MG/ML: 140 | 28 days supply | Qty: 2 | Fill #0

## 2018-12-27 NOTE — Telephone Encounter (Signed)
PATIENT IS FUTURE ACTIVE, [id number]  The msg above is the response I got when I entered the info to cmm

## 2019-01-27 MED FILL — REPATHA SURECLICK 140 MG/ML: 140 | 28 days supply | Qty: 2 | Fill #1

## 2019-02-21 MED FILL — REPATHA SURECLICK 140 MG/ML: 140 | 28 days supply | Qty: 2 | Fill #2

## 2019-03-21 MED FILL — REPATHA SURECLICK 140 MG/ML: 140 | 28 days supply | Qty: 2 | Fill #3

## 2019-04-18 MED FILL — REPATHA SURECLICK 140 MG/ML: 140 | 28 days supply | Qty: 2 | Fill #4

## 2019-05-16 MED FILL — REPATHA SURECLICK 140 MG/ML: 140 | 28 days supply | Qty: 2 | Fill #5

## 2019-06-10 MED FILL — REPATHA SURECLICK 140 MG/ML: 140 | 28 days supply | Qty: 2 | Fill #6

## 2019-06-24 ENCOUNTER — Ambulatory Visit: Payer: Managed Care, Other (non HMO) | Admitting: Cardiovascular Disease

## 2019-06-28 ENCOUNTER — Ambulatory Visit: Payer: Managed Care, Other (non HMO) | Admitting: Cardiovascular Disease

## 2019-06-28 ENCOUNTER — Encounter: Payer: Self-pay | Admitting: Cardiovascular Disease

## 2019-06-28 ENCOUNTER — Other Ambulatory Visit: Payer: Self-pay

## 2019-06-28 VITALS — BP 146/95 | HR 67 | Temp 97.7°F | Ht 65.0 in | Wt 201.2 lb

## 2019-06-28 DIAGNOSIS — Z951 Presence of aortocoronary bypass graft: Secondary | ICD-10-CM | POA: Diagnosis not present

## 2019-06-28 DIAGNOSIS — I739 Peripheral vascular disease, unspecified: Secondary | ICD-10-CM

## 2019-06-28 MED ORDER — REPATHA SURECLICK 140 MG/ML ~~LOC~~ SOAJ: 140.0000 mg | SUBCUTANEOUS | 11 refills | Status: DC

## 2019-06-28 MED ORDER — METOPROLOL TARTRATE 25 MG PO TABS: ORAL_TABLET | ORAL | 3 refills | Status: DC

## 2019-06-28 MED ORDER — LOSARTAN POTASSIUM 25 MG PO TABS: 25.0000 mg | ORAL_TABLET | Freq: Every day | ORAL | 3 refills | Status: DC

## 2019-06-28 MED FILL — METOPROLOL TARTRATE 25 MG T: 25 | 90 days supply | Qty: 135 | Fill #0

## 2019-06-28 MED FILL — LOSARTAN POTASSIUM 25 MG TA: 25 | 90 days supply | Qty: 90 | Fill #0

## 2019-06-28 NOTE — Progress Notes (Signed)
06/28/2019 Derek Hayden   [birth year]  [id number]  Primary Physician Prince Solian, MD Primary Cardiologist: Lorretta Harp MD Lupe Carney, [place]  HPI:  Derek Hayden is a 68 y.o.    mildly overweight, divorced, Caucasian male father of 65, grandfather to 2 grandchildren who I last saw in the office 05/29/2017. He is currently working at [employer] . Marland Kitchen He has a history of CAD status post cath Jan 07, 2011 revealing left main 2-vessel disease with mild LV dysfunction. He ultimately underwent coronary artery bypass grafting x4 by Dr. Tharon Aquas Trigt with a LIMA to his LAD, a vein to a diagonal branch, OM and PDA. His hospital course was uncomplicated, and he recuperated nicely. He had an echo performed April 03, 2011 that was normal with some mild septal hypokinesia and a Myoview that was normal as well.  He exercises on a daily basis doing to 200 crunches in the morning and 150 pushups.  He also does a total body workout 3 days a week.  He has also had his left ankle operated on since I saw him last and has recuperated nicely. He has bilateral rotator cuff tears and will need to have this addressed in the future as well.  Since I saw him in the office a year ago he is completely asymptomatic. He is statin intolerant and was begun onRepatha with an excellent clinical result. LDL fell from 110-53.   Current Meds  Medication Sig  . Ascorbic Acid (VITAMIN C ADULT GUMMIES PO) Take by mouth daily.  Marland Kitchen aspirin 162 MG EC tablet Take 162 mg by mouth daily.    . cholecalciferol (VITAMIN D) 1000 units tablet Take 1,000 Units by mouth daily.  . Coenzyme Q10 (CO Q 10 PO) Take 1 tablet by mouth daily.  . Evolocumab (REPATHA SURECLICK) XX123456 MG/ML SOAJ Inject 140 mg into the skin every 14 (fourteen) days.  Marland Kitchen losartan (COZAAR) 25 MG tablet TAKE 1 TABLET BY MOUTH EVERY DAY  . metoprolol tartrate (LOPRESSOR) 25 MG tablet TAKE ONE-HALF (1/2) TABLET BY MOUTH THREE TIMES DAILY  . Multiple Vitamin  (MULTIVITAMIN PO) Take 1 tablet by mouth daily.    . [DISCONTINUED] losartan (COZAAR) 25 MG tablet Take 1 tablet (25 mg total) by mouth daily. PLEASE MAKE APPOINTMENT FOR FURTHER REFILLS (Patient taking differently: Take 25 mg by mouth daily. )  . [DISCONTINUED] losartan (COZAAR) 25 MG tablet Take one (1) tablet by mouth daily. SCHEDULE OV FOR REFILLS.  . [DISCONTINUED] losartan (COZAAR) 25 MG tablet TAKE 1 TABLET BY MOUTH DAILY (SCHEDULE OFFICE VISIT FOR REFILLS)     No Known Allergies  Social History   Socioeconomic History  . Marital status: Single    Spouse name: Not on file  . Number of children: Not on file  . Years of education: Not on file  . Highest education level: Not on file  Occupational History  . Not on file  Social Needs  . Financial resource strain: Not on file  . Food insecurity    Worry: Not on file    Inability: Not on file  . Transportation needs    Medical: Not on file    Non-medical: Not on file  Tobacco Use  . Smoking status: Never Smoker  . Smokeless tobacco: Never Used  Substance and Sexual Activity  . Alcohol use: No  . Drug use: No  . Sexual activity: Not on file  Lifestyle  . Physical activity  Days per week: Not on file    Minutes per session: Not on file  . Stress: Not on file  Relationships  . Social Herbalist on phone: Not on file    Gets together: Not on file    Attends religious service: Not on file    Active member of club or organization: Not on file    Attends meetings of clubs or organizations: Not on file    Relationship status: Not on file  . Intimate partner violence    Fear of current or ex partner: Not on file    Emotionally abused: Not on file    Physically abused: Not on file    Forced sexual activity: Not on file  Other Topics Concern  . Not on file  Social History Narrative  . Not on file     Review of Systems: General: negative for chills, fever, night sweats or weight changes.  Cardiovascular:  negative for chest pain, dyspnea on exertion, edema, orthopnea, palpitations, paroxysmal nocturnal dyspnea or shortness of breath Dermatological: negative for rash Respiratory: negative for cough or wheezing Urologic: negative for hematuria Abdominal: negative for nausea, vomiting, diarrhea, bright red blood per rectum, melena, or hematemesis Neurologic: negative for visual changes, syncope, or dizziness All other systems reviewed and are otherwise negative except as noted above.    Blood pressure (!) 146/95, pulse 67, temperature 97.7 F (36.5 C), height 5\' 5"  (1.651 m), weight 201 lb 3.2 oz (91.3 kg), SpO2 99 %.  General appearance: alert and no distress Neck: no adenopathy, no carotid bruit, no JVD, supple, symmetrical, trachea midline and thyroid not enlarged, symmetric, no tenderness/mass/nodules Lungs: clear to auscultation bilaterally Heart: regular rate and rhythm, S1, S2 normal, no murmur, click, rub or gallop Extremities: extremities normal, atraumatic, no cyanosis or edema Pulses: 2+ and symmetric Skin: Skin color, texture, turgor normal. No rashes or lesions Neurologic: Alert and oriented X 3, normal strength and tone. Normal symmetric reflexes. Normal coordination and gait  EKG sinus rhythm at 66 without ST or T wave changes.  Personally reviewed this EKG.  ASSESSMENT AND PLAN:   S/P CABG x 4: Jan 08, 2011,  Dr. Prescott Gum, LIMA to the LAD, SVG to diag, SVG to OM, SVG to PDA.   History of CAD status post CABG x4 by Dr. Darcey Nora Jan 07, 2011 with a LIMA to his LAD, vein to the diagonal branch, obtuse marginal branch and PDA.  He denies chest pain or shortness of breath.  He is fairly active and exercises on a daily basis.  HTN (hypertension) History of essential hypertension with blood pressure measured at 146/95.  He is on losartan and metoprolol  Hyperlipidemia History of hyperlipidemia on Repatha with lipid profile performed 10/26/2018 revealed a total cholesterol 108, LDL  54 and HDL [place] MD [place], [facility] 06/28/2019 4:33 PM

## 2019-06-28 NOTE — Assessment & Plan Note (Signed)
History of hyperlipidemia on Repatha with lipid profile performed 10/26/2018 revealed a total cholesterol 108, LDL 54 and HDL 44

## 2019-06-28 NOTE — Assessment & Plan Note (Signed)
History of CAD status post CABG x4 by Dr. Darcey Nora Jan 07, 2011 with a LIMA to his LAD, vein to the diagonal branch, obtuse marginal branch and PDA.  He denies chest pain or shortness of breath.  He is fairly active and exercises on a daily basis.

## 2019-06-28 NOTE — Patient Instructions (Signed)
Medication Instructions:  Your physician recommends that you continue on your current medications as directed. Please refer to the Current Medication list given to you today.  If you need a refill on your cardiac medications before your next appointment, please call your pharmacy.   Lab work: NONE  Testing/Procedures: NONE  Follow-Up: At [facility], you and your health needs are our priority.  As part of our continuing mission to provide you with exceptional heart care, we have created designated Provider Care Teams.  These Care Teams include your primary Cardiologist (physician) and Advanced Practice Providers (APPs -  Physician Assistants and Nurse Practitioners) who all work together to provide you with the care you need, when you need it. You may see Dr Berry or one of the following Advanced Practice Providers on your designated Care Team:    Luke Kilroy, PA-C  Callie Goodrich, PA-C  Jesse Cleaver, FNP  Your physician wants you to follow-up in: 1 year. You will receive a reminder letter in the mail two months in advance. If you don't receive a letter, please call our office to schedule the follow-up appointment.      

## 2019-06-28 NOTE — Assessment & Plan Note (Signed)
History of essential hypertension with blood pressure measured at 146/95.  He is on losartan and metoprolol

## 2019-06-30 MED FILL — REPATHA SURECLICK 140 MG/ML: 140 | 28 days supply | Qty: 2 | Fill #0

## 2019-08-05 MED FILL — REPATHA SURECLICK 140 MG/ML: 140 | 28 days supply | Qty: 2 | Fill #1

## 2019-08-30 LAB — IFOBT (OCCULT BLOOD): IFOBT: NEGATIVE

## 2019-09-05 MED FILL — REPATHA SURECLICK 140 MG/ML: 140 | 28 days supply | Qty: 2 | Fill #2

## 2019-09-12 ENCOUNTER — Telehealth: Payer: Self-pay | Admitting: Physician Assistant

## 2019-09-12 NOTE — Telephone Encounter (Signed)
Called to discuss with Derek Hayden about Covid symptoms and the use of bamlanivimab, a monoclonal antibody infusion for those with mild to moderate Covid symptoms and at a high risk of hospitalization.    Pt is qualified for this infusion at the [facility] infusion center due to co-morbid conditions and/or a member of an at-risk group, however declines infusion at this time. Symptoms tier reviewed as well as criteria for ending isolation.  Symptoms reviewed that would warrant ED/Hospital evaluation. Preventative practices reviewed. Patient verbalized understanding. Patient advised to call back if he decides that he does want to get infusion. Callback number to the infusion center given. Patient advised to go to Urgent care or ED with severe symptoms. Last date he would be eligible for infusion is 09/14/19.   Patient Active Problem List   Diagnosis Date Noted  . Hyperlipidemia 04/26/2013  . CAD (coronary artery disease):  04/21/2013  . S/P CABG x 4: Jan 08, 2011,  Dr. Prescott Gum, LIMA to the LAD, SVG to diag, SVG to OM, SVG to PDA.   04/21/2013  . HTN (hypertension) 04/21/2013    Angelena Form PA-C  [facility]

## 2019-09-12 NOTE — Telephone Encounter (Signed)
Patient called me back later and decided he wanted to be signed up. Unfortunately, 1/20 is all full. I have placed him on the wait list.    Angelena Form PA-C  [facility]

## 2019-09-14 ENCOUNTER — Ambulatory Visit (HOSPITAL_COMMUNITY)
Admission: RE | Admit: 2019-09-14 | Discharge: 2019-09-14 | Disposition: A | Discharge status: Home / Self Care | Payer: Managed Care, Other (non HMO) | Source: Ambulatory Visit | Attending: Pulmonary Disease | Admitting: Pulmonary Disease

## 2019-09-14 ENCOUNTER — Encounter (HOSPITAL_COMMUNITY): Payer: Self-pay

## 2019-09-14 ENCOUNTER — Other Ambulatory Visit: Payer: Self-pay | Admitting: Adult Health

## 2019-09-14 DIAGNOSIS — U071 COVID-19: Secondary | ICD-10-CM

## 2019-09-14 DIAGNOSIS — Z23 Encounter for immunization: Secondary | ICD-10-CM | POA: Diagnosis not present

## 2019-09-14 MED ORDER — METHYLPREDNISOLONE SODIUM SUCC 125 MG IJ SOLR: 125.0000 mg | Freq: Once | INTRAMUSCULAR | Status: DC | PRN

## 2019-09-14 MED ORDER — FAMOTIDINE IN NACL 20-0.9 MG/50ML-% IV SOLN: 20.0000 mg | Freq: Once | INTRAVENOUS | Status: DC | PRN

## 2019-09-14 MED ORDER — SODIUM CHLORIDE 0.9 % IV SOLN: INTRAVENOUS | Status: DC | PRN

## 2019-09-14 MED ORDER — EPINEPHRINE 0.3 MG/0.3ML IJ SOAJ: 0.3000 mg | Freq: Once | INTRAMUSCULAR | Status: DC | PRN

## 2019-09-14 MED ORDER — SODIUM CHLORIDE 0.9 % IV SOLN
700.0000 mg | Freq: Once | INTRAVENOUS | Status: AC
  Administered 2019-09-14: 700 mg via INTRAVENOUS
  Filled 2019-09-14: qty 20

## 2019-09-14 MED ORDER — DIPHENHYDRAMINE HCL 50 MG/ML IJ SOLN: 50.0000 mg | Freq: Once | INTRAMUSCULAR | Status: DC | PRN

## 2019-09-14 MED ORDER — ALBUTEROL SULFATE HFA 108 (90 BASE) MCG/ACT IN AERS: 2.0000 | INHALATION_SPRAY | Freq: Once | RESPIRATORY_TRACT | Status: DC | PRN

## 2019-09-14 NOTE — Discharge Instructions (Signed)
10 Things You Can Do to Manage Your COVID-19 Symptoms at Home If you have possible or confirmed COVID-19: 1. Stay home from work and school. And stay away from other public places. If you must go out, avoid using any kind of public transportation, ridesharing, or taxis. 2. Monitor your symptoms carefully. If your symptoms get worse, call your healthcare provider immediately. 3. Get rest and stay hydrated. 4. If you have a medical appointment, call the healthcare provider ahead of time and tell them that you have or may have COVID-19. 5. For medical emergencies, call 911 and notify the dispatch personnel that you have or may have COVID-19. 6. Cover your cough and sneezes with a tissue or use the inside of your elbow. 7. Wash your hands often with soap and water for at least 20 seconds or clean your hands with an alcohol-based hand sanitizer that contains at least 60% alcohol. 8. As much as possible, stay in a specific room and away from other people in your home. Also, you should use a separate bathroom, if available. If you need to be around other people in or outside of the home, wear a mask. 9. Avoid sharing personal items with other people in your household, like dishes, towels, and bedding. 10. Clean all surfaces that are touched often, like counters, tabletops, and doorknobs. Use household cleaning sprays or wipes according to the label instructions. cdc.gov/coronavirus 02/23/2019 This information is not intended to replace advice given to you by your health care provider. Make sure you discuss any questions you have with your health care provider. Document Revised: 07/28/2019 Document Reviewed: 07/28/2019 Elsevier Patient Education  2020 Elsevier Inc.  

## 2019-09-14 NOTE — Progress Notes (Signed)
  I connected by phone with Derek Hayden on 09/14/2019 at 9:46 AM to discuss the potential use of an new treatment for mild to moderate COVID-19 viral infection in non-hospitalized patients.   This patient is a 69 y.o. male that meets the FDA criteria for Emergency Use Authorization of bamlanivimab or casirivimab\imdevimab.  Has a (+) direct SARS-CoV-2 viral test result  Has mild or moderate COVID-19   Is ? 67 years of age and weighs ? 40 kg  Is NOT hospitalized due to COVID-19  Is NOT requiring oxygen therapy or requiring an increase in baseline oxygen flow rate due to COVID-19  Is within 10 days of symptom onset  Has at least one of the high risk factor(s) for progression to severe COVID-19 and/or hospitalization as defined in EUA.  Specific high risk criteria : >/= 67 yo  Patient aware to bring copy of COVID 19 positive test results.   I have spoken and communicated the following to the patient or parent/caregiver:  1. FDA has authorized the emergency use of bamlanivimab and casirivimab\imdevimab for the treatment of mild to moderate COVID-19 in adults and pediatric patients with positive results of direct SARS-CoV-2 viral testing who are 65 years of age and older weighing at least 40 kg, and who are at high risk for progressing to severe COVID-19 and/or hospitalization.  2. The significant known and potential risks and benefits of bamlanivimab and casirivimab\imdevimab, and the extent to which such potential risks and benefits are unknown.  3. Information on available alternative treatments and the risks and benefits of those alternatives, including clinical trials.  4. Patients treated with bamlanivimab and casirivimab\imdevimab should continue to self-isolate and use infection control measures (e.g., wear mask, isolate, social distance, avoid sharing personal items, clean and disinfect "high touch" surfaces, and frequent handwashing) according to CDC guidelines.   5. The  patient or parent/caregiver has the option to accept or refuse bamlanivimab or casirivimab\imdevimab .  After reviewing this information with the patient, The patient agreed to proceed with receiving the bamlanimivab infusion and will be provided a copy of the Fact sheet prior to receiving the infusion.Derek Hayden Derek Hayden 09/14/2019 9:46 AM

## 2019-09-14 NOTE — Progress Notes (Signed)
  Diagnosis: COVID-19  Physician: Dr. Prince Solian  Procedure: Covid Infusion Clinic Med: bamlanivimab infusion - Provided patient with bamlanimivab fact sheet for patients, parents and caregivers prior to infusion.  Complications: No immediate complications noted.  Discharge: Discharged home   Frimet Durfee L 09/14/2019

## 2019-09-26 MED FILL — LOSARTAN POTASSIUM 25 MG TA: 25 | 90 days supply | Qty: 90 | Fill #1

## 2019-09-26 MED FILL — METOPROLOL TARTRATE 25 MG T: 25 | 90 days supply | Qty: 135 | Fill #1

## 2019-10-03 MED FILL — REPATHA SURECLICK 140 MG/ML: 140 | 28 days supply | Qty: 2 | Fill #3

## 2019-10-31 MED FILL — REPATHA SURECLICK 140 MG/ML: 140 | 28 days supply | Qty: 2 | Fill #4

## 2019-11-28 MED FILL — REPATHA SURECLICK 140 MG/ML: 140 | 28 days supply | Qty: 2 | Fill #5

## 2019-12-23 MED FILL — LOSARTAN POTASSIUM 25 MG TA: 25 | 90 days supply | Qty: 90 | Fill #2

## 2019-12-23 MED FILL — REPATHA SURECLICK 140 MG/ML: 140 | 28 days supply | Qty: 2 | Fill #6

## 2019-12-23 MED FILL — METOPROLOL TARTRATE 25 MG T: 25 | 90 days supply | Qty: 135 | Fill #2

## 2020-01-06 ENCOUNTER — Encounter: Payer: Self-pay | Admitting: Internal Medicine

## 2020-01-24 MED FILL — REPATHA SURECLICK 140 MG/ML: 140 | 28 days supply | Qty: 2 | Fill #7

## 2020-02-17 MED FILL — REPATHA SURECLICK 140 MG/ML: 140 | 28 days supply | Qty: 2 | Fill #8

## 2020-03-09 ENCOUNTER — Other Ambulatory Visit: Payer: Self-pay

## 2020-03-09 ENCOUNTER — Ambulatory Visit: Payer: Medicare Other

## 2020-03-09 ENCOUNTER — Encounter: Payer: Self-pay | Admitting: Internal Medicine

## 2020-03-09 VITALS — Ht 65.0 in | Wt 196.0 lb

## 2020-03-09 DIAGNOSIS — Z86010 Personal history of colonic polyps: Secondary | ICD-10-CM

## 2020-03-09 MED ORDER — SUTAB 1479-225-188 MG PO TABS: 1.0000 | ORAL_TABLET | ORAL | 0 refills | Status: DC

## 2020-03-09 MED FILL — SUTAB 1479-225-188 MG TABS: 1479-225-18 | 1 days supply | Qty: 24 | Fill #0

## 2020-03-09 NOTE — Progress Notes (Signed)
Denies allergies to eggs or soy products. Denies complication of anesthesia or sedation. Denies use of weight loss medication. Denies use of O2.   Emmi instructions given for colonoscopy.   Patient is scheduled for Covid testing at 3:20 Pm on 03/21/20.

## 2020-03-12 ENCOUNTER — Encounter: Payer: Self-pay | Admitting: *Deleted

## 2020-03-13 MED FILL — REPATHA SURECLICK 140 MG/ML: 140 | 28 days supply | Qty: 2 | Fill #9

## 2020-03-13 MED FILL — METOPROLOL TARTRATE 25 MG T: 25 | 90 days supply | Qty: 135 | Fill #3

## 2020-03-13 MED FILL — LOSARTAN POTASSIUM 25 MG TA: 25 | 90 days supply | Qty: 90 | Fill #3

## 2020-03-19 ENCOUNTER — Other Ambulatory Visit: Payer: Self-pay

## 2020-03-19 ENCOUNTER — Ambulatory Visit: Payer: Medicare Other | Admitting: Dermatology

## 2020-03-19 ENCOUNTER — Encounter: Payer: Self-pay | Admitting: Dermatology

## 2020-03-19 DIAGNOSIS — D489 Neoplasm of uncertain behavior, unspecified: Secondary | ICD-10-CM

## 2020-03-19 DIAGNOSIS — C4491 Basal cell carcinoma of skin, unspecified: Secondary | ICD-10-CM

## 2020-03-19 DIAGNOSIS — L570 Actinic keratosis: Secondary | ICD-10-CM

## 2020-03-19 DIAGNOSIS — D485 Neoplasm of uncertain behavior of skin: Secondary | ICD-10-CM

## 2020-03-19 HISTORY — DX: Basal cell carcinoma of skin, unspecified: C44.91

## 2020-03-19 NOTE — Patient Instructions (Signed)

## 2020-03-21 ENCOUNTER — Ambulatory Visit (INDEPENDENT_AMBULATORY_CARE_PROVIDER_SITE_OTHER): Payer: Medicare Other

## 2020-03-21 ENCOUNTER — Other Ambulatory Visit: Payer: Self-pay

## 2020-03-21 ENCOUNTER — Other Ambulatory Visit: Payer: Self-pay | Admitting: Internal Medicine

## 2020-03-21 DIAGNOSIS — Z1159 Encounter for screening for other viral diseases: Secondary | ICD-10-CM

## 2020-03-22 LAB — SARS CORONAVIRUS 2 (TAT 6-24 HRS): SARS Coronavirus 2: NEGATIVE

## 2020-03-23 ENCOUNTER — Other Ambulatory Visit: Payer: Self-pay

## 2020-03-23 ENCOUNTER — Ambulatory Visit (AMBULATORY_SURGERY_CENTER): Payer: Medicare Other | Admitting: Internal Medicine

## 2020-03-23 ENCOUNTER — Encounter: Payer: Self-pay | Admitting: Internal Medicine

## 2020-03-23 VITALS — BP 128/85 | HR 64 | Temp 98.1°F | Resp 17 | Ht 65.0 in | Wt 196.0 lb

## 2020-03-23 DIAGNOSIS — Z86010 Personal history of colonic polyps: Secondary | ICD-10-CM | POA: Diagnosis not present

## 2020-03-23 MED ORDER — SODIUM CHLORIDE 0.9 % IV SOLN: 500.0000 mL | Freq: Once | INTRAVENOUS | Status: DC

## 2020-03-23 NOTE — Op Note (Signed)
[place] Patient Name: Derek Hayden Procedure Date: 03/23/2020 8:44 AM MRN: [id number] Endoscopist: Docia Chuck. Henrene Pastor , MD Age: 69 Referring MD:  Date of Birth: April 10, 1951 Gender: Male Account #: [id number] Procedure:                Colonoscopy Indications:              High risk colon cancer surveillance: Personal                            history of non-advanced adenoma. Previous                            examination August 2012 Medicines:                Monitored Anesthesia Care Procedure:                Pre-Anesthesia Assessment:                           - Prior to the procedure, a History and Physical                            was performed, and patient medications and                            allergies were reviewed. The patient's tolerance of                            previous anesthesia was also reviewed. The risks                            and benefits of the procedure and the sedation                            options and risks were discussed with the patient.                            All questions were answered, and informed consent                            was obtained. Prior Anticoagulants: The patient has                            taken no previous anticoagulant or antiplatelet                            agents. ASA Grade Assessment: II - A patient with                            mild systemic disease. After reviewing the risks                            and benefits, the patient was deemed in  satisfactory condition to undergo the procedure.                           After obtaining informed consent, the colonoscope                            was passed under direct vision. Throughout the                            procedure, the patient's blood pressure, pulse, and                            oxygen saturations were monitored continuously. The                            Colonoscope was introduced through the anus and                             advanced to the the cecum, identified by                            appendiceal orifice and ileocecal valve. The                            ileocecal valve, appendiceal orifice, and rectum                            were photographed. The quality of the bowel                            preparation was excellent. The colonoscopy was                            performed without difficulty. The patient tolerated                            the procedure well. The bowel preparation used was                            SUPREP/tablets via split dose instruction. Scope In: 9:03:51 AM Scope Out: 9:11:51 AM Scope Withdrawal Time: 0 hours 6 minutes 52 seconds  Total Procedure Duration: 0 hours 8 minutes 0 seconds  Findings:                 Multiple diverticula were found in the sigmoid                            colon. Small internal hemorrhoids present.                           The exam was otherwise without abnormality on                            direct and retroflexion views. Complications:  No immediate complications. Estimated blood loss:                            None. Estimated Blood Loss:     Estimated blood loss: none. Impression:               - Diverticulosis in the sigmoid colon.                           - The examination was otherwise normal on direct                            and retroflexion views.                           - No specimens collected. Recommendation:           - Repeat colonoscopy in 10 years for surveillance.                           - Patient has a contact number available for                            emergencies. The signs and symptoms of potential                            delayed complications were discussed with the                            patient. Return to normal activities tomorrow.                            Written discharge instructions were provided to the                            patient.                            - Resume previous diet.                           - Continue present medications. Docia Chuck. Henrene Pastor, MD 03/23/2020 9:29:43 AM This report has been signed electronically.

## 2020-03-23 NOTE — Patient Instructions (Addendum)
Handouts were given to you on diverticulosis,hemorrhoids,  and a high fiber diet with liberal fluid intake. You may resume your current medications today. Repeat colonoscopy in 10 years for surveillance. Please call if any questions or concerns.    YOU HAD AN ENDOSCOPIC PROCEDURE TODAY AT [facility]:   Refer to the procedure report that was given to you for any specific questions about what was found during the examination.  If the procedure report does not answer your questions, please call your gastroenterologist to clarify.  If you requested that your care partner not be given the details of your procedure findings, then the procedure report has been included in a sealed envelope for you to review at your convenience later.  YOU SHOULD EXPECT: Some feelings of bloating in the abdomen. Passage of more gas than usual.  Walking can help get rid of the air that was put into your GI tract during the procedure and reduce the bloating. If you had a lower endoscopy (such as a colonoscopy or flexible sigmoidoscopy) you may notice spotting of blood in your stool or on the toilet paper. If you underwent a bowel prep for your procedure, you may not have a normal bowel movement for a few days.  Please Note:  You might notice some irritation and congestion in your nose or some drainage.  This is from the oxygen used during your procedure.  There is no need for concern and it should clear up in a day or so.  SYMPTOMS TO REPORT IMMEDIATELY:   Following lower endoscopy (colonoscopy or flexible sigmoidoscopy):  Excessive amounts of blood in the stool  Significant tenderness or worsening of abdominal pains  Swelling of the abdomen that is new, acute  Fever of 100F or higher   For urgent or emergent issues, a gastroenterologist can be reached at any hour by calling [phone]. Do not use MyChart messaging for urgent concerns.    DIET:  We do recommend a small meal at first, but then  you may proceed to your regular diet.  Drink plenty of fluids but you should avoid alcoholic beverages for 24 hours.  ACTIVITY:  You should plan to take it easy for the rest of today and you should NOT DRIVE or use heavy machinery until tomorrow (because of the sedation medicines used during the test).    FOLLOW UP: Our staff will call the number listed on your records 48-72 hours following your procedure to check on you and address any questions or concerns that you may have regarding the information given to you following your procedure. If we do not reach you, we will leave a message.  We will attempt to reach you two times.  During this call, we will ask if you have developed any symptoms of COVID 19. If you develop any symptoms (ie: fever, flu-like symptoms, shortness of breath, cough etc.) before then, please call [phone].  If you test positive for Covid 19 in the 2 weeks post procedure, please call and report this information to [place].    If any biopsies were taken you will be contacted by phone or by letter within the next 1-3 weeks.  Please call us at [phone] if you have not heard about the biopsies in 3 weeks.    SIGNATURES/CONFIDENTIALITY: You and/or your care partner have signed paperwork which will be entered into your electronic medical record.  These signatures attest to the fact that that the information above on your After Visit  Summary has been reviewed and is understood.  Full responsibility of the confidentiality of this discharge information lies with you and/or your care-partner.

## 2020-03-23 NOTE — Progress Notes (Signed)
Pt's states no medical or surgical changes since previsit or office visit. VS by CW. 

## 2020-03-23 NOTE — Progress Notes (Signed)
pt tolerated well. VSS. awake and to recovery. Report given to RN.  

## 2020-03-23 NOTE — Progress Notes (Signed)
No problems noted in the recovery room. maw 

## 2020-03-26 ENCOUNTER — Encounter: Payer: Self-pay | Admitting: *Deleted

## 2020-03-26 ENCOUNTER — Telehealth: Payer: Self-pay

## 2020-03-26 ENCOUNTER — Telehealth: Payer: Self-pay | Admitting: *Deleted

## 2020-03-26 NOTE — Telephone Encounter (Signed)
Called and lmom for the pt to call us back w/new insurance information

## 2020-03-26 NOTE — Telephone Encounter (Signed)
Path report to patient. Has a surgery scheduled with Dr tafeen at 9/23 3:00.

## 2020-03-26 NOTE — Telephone Encounter (Signed)
-----   Message from Lavonna Monarch, MD sent at 03/24/2020  6:24 AM EDT ----- Schedule surgery with Dr. Darene Lamer

## 2020-03-27 ENCOUNTER — Telehealth: Payer: Self-pay | Admitting: *Deleted

## 2020-03-27 NOTE — Telephone Encounter (Signed)
  Follow up Call-  Call back number 03/23/2020  Post procedure Call Back phone  # [phone]  Permission to leave phone message Yes  Some recent data might be hidden     Patient questions:  Do you have a fever, pain , or abdominal swelling? No. Pain Score  0 *  Have you tolerated food without any problems? Yes.    Have you been able to return to your normal activities? Yes.    Do you have any questions about your discharge instructions: Diet   No. Medications  No. Follow up visit  No.  Do you have questions or concerns about your Care? No.  Actions: * If pain score is 4 or above: No action needed, pain <4.  1. Have you developed a fever since your procedure? no  2.   Have you had an respiratory symptoms (SOB or cough) since your procedure? no  3.   Have you tested positive for COVID 19 since your procedure no  4.   Have you had any family members/close contacts diagnosed with the COVID 19 since your procedure?  no   If yes to any of these questions please route to Joylene John, RN and Erenest Rasher, RN

## 2020-03-27 NOTE — Telephone Encounter (Signed)
No answer for post procedure call back. Left message for patient.

## 2020-04-13 MED FILL — REPATHA SURECLICK 140 MG/ML: 140 | 28 days supply | Qty: 2 | Fill #10

## 2020-04-28 ENCOUNTER — Encounter: Payer: Self-pay | Admitting: Dermatology

## 2020-04-28 NOTE — Progress Notes (Signed)
   Follow-Up Visit   Subjective  Derek Hayden is a 69 y.o. male who presents for the following: Follow-up (spot on the face and colar bone--sensitive to touch, redness--few months.).  New growths Location: Face and neck Duration: Several months Quality:  Associated Signs/Symptoms: Modifying Factors:  Severity:  Timing: Context: History of multiple nonmelanoma skin cancers  Objective  Well appearing patient in no apparent distress; mood and affect are within normal limits.  All skin waist up examined.   Assessment & Plan    Neoplasm of uncertain behavior of skin (2) Neck - Anterior  Skin / nail biopsy Type of biopsy: tangential   Informed consent: discussed and consent obtained   Timeout: patient name, date of birth, surgical site, and procedure verified   Procedure prep:  Patient was prepped and draped in usual sterile fashion (Non sterile) Prep type:  Chlorhexidine Anesthesia: the lesion was anesthetized in a standard fashion   Anesthetic:  1% lidocaine w/ epinephrine 1-100,000 local infiltration Instrument used: flexible razor blade   Outcome: patient tolerated procedure well   Post-procedure details: wound care instructions given    Specimen 1 - Surgical pathology Differential Diagnosis: scc vs bcc Check Margins: No  Left Zygomatic Area  Skin / nail biopsy Type of biopsy: tangential   Informed consent: discussed and consent obtained   Timeout: patient name, date of birth, surgical site, and procedure verified   Procedure prep:  Patient was prepped and draped in usual sterile fashion (Non sterile) Prep type:  Chlorhexidine Anesthesia: the lesion was anesthetized in a standard fashion   Anesthetic:  1% lidocaine w/ epinephrine 1-100,000 local infiltration Instrument used: flexible razor blade   Outcome: patient tolerated procedure well   Post-procedure details: wound care instructions given    Specimen 2 - Surgical pathology Differential Diagnosis: scc vs  bcc Check Margins: No  AK (actinic keratosis) Left Buccal Cheek   Destruction of lesion - Left Buccal Cheek  Complexity: simple   Destruction method: cryotherapy   Informed consent: discussed and consent obtained   Timeout:  patient name, date of birth, surgical site, and procedure verified Lesion destroyed using liquid nitrogen: Yes   Cryotherapy cycles:  5 Outcome: patient tolerated procedure well with no complications   Post-procedure details: wound care instructions given    Neoplasm of uncertain behavior Left Posterior Neck     I, Lavonna Monarch, MD, have reviewed all documentation for this visit.  The documentation on 04/28/20 for the exam, diagnosis, procedures, and orders are all accurate and complete.

## 2020-05-14 MED FILL — REPATHA SURECLICK 140 MG/ML: 140 | 28 days supply | Qty: 2 | Fill #11

## 2020-05-17 ENCOUNTER — Other Ambulatory Visit: Payer: Self-pay

## 2020-05-17 ENCOUNTER — Ambulatory Visit (INDEPENDENT_AMBULATORY_CARE_PROVIDER_SITE_OTHER): Payer: Medicare Other | Admitting: Dermatology

## 2020-05-17 ENCOUNTER — Encounter: Payer: Self-pay | Admitting: Dermatology

## 2020-05-17 DIAGNOSIS — C4441 Basal cell carcinoma of skin of scalp and neck: Secondary | ICD-10-CM | POA: Diagnosis not present

## 2020-05-17 DIAGNOSIS — C4491 Basal cell carcinoma of skin, unspecified: Secondary | ICD-10-CM

## 2020-05-17 DIAGNOSIS — L570 Actinic keratosis: Secondary | ICD-10-CM

## 2020-05-17 NOTE — Patient Instructions (Signed)

## 2020-05-17 NOTE — Progress Notes (Signed)
Right shoulder/collor bone- curet cautery 5FU 1.0 cm Tx size  LN2 x4 Left cheek

## 2020-06-11 ENCOUNTER — Other Ambulatory Visit: Payer: Self-pay | Admitting: Cardiovascular Disease

## 2020-06-12 ENCOUNTER — Other Ambulatory Visit: Payer: Self-pay | Admitting: Cardiovascular Disease

## 2020-06-12 MED FILL — METOPROLOL TARTRATE 25 MG T: 25 | 90 days supply | Qty: 135 | Fill #0

## 2020-06-12 MED FILL — LOSARTAN POTASSIUM 25 MG TA: 25 | 90 days supply | Qty: 90 | Fill #0

## 2020-06-12 MED FILL — REPATHA SURECLICK 140 MG/ML: 140 | 28 days supply | Qty: 2 | Fill #0

## 2020-06-20 NOTE — Progress Notes (Signed)
   Follow-Up Visit   Subjective  Derek Hayden is a 69 y.o. male who presents for the following: Procedure ([facility] NOD.).  Procudure Location: Neck Duration:  Quality:  Associated Signs/Symptoms: Modifying Factors:  Severity:  Timing: Context:   Objective  Well appearing patient in no apparent distress; mood and affect are within normal limits.  A focused examination was performed including Neck. Relevant physical exam findings are noted in the Assessment and Plan.   Assessment & Plan    Basal cell carcinoma (BCC), unspecified site Neck - Anterior  Destruction of lesion Complexity: simple   Destruction method: electrodesiccation and curettage   Informed consent: discussed and consent obtained   Timeout:  patient name, date of birth, surgical site, and procedure verified Anesthesia: the lesion was anesthetized in a standard fashion   Anesthetic:  1% lidocaine w/ epinephrine 1-100,000 local infiltration Curettage performed in three different directions: Yes   Curettage cycles:  3 Lesion length (cm):  1 Lesion width (cm):  1 Margin per side (cm):  0 Final wound size (cm):  1 Hemostasis achieved with:  ferric subsulfate Outcome: patient tolerated procedure well with no complications   Post-procedure details: sterile dressing applied and wound care instructions given   Dressing type: bandage and petrolatum   Additional details:  Wound inoculated with 5% Fluorouracil.    AK (actinic keratosis) (4) Left Parotid Area  Follow as needed. As long as spots heal smooth to the touch no more treatment needed.   Destruction of lesion - Left Parotid Area Complexity: simple   Destruction method: cryotherapy   Informed consent: discussed and consent obtained   Lesion destroyed using liquid nitrogen: Yes   Cryotherapy cycles:  5 Outcome: patient tolerated procedure well with no complications       I, Lavonna Monarch, MD, have reviewed all documentation for this visit.  The  documentation on 06/23/20 for the exam, diagnosis, procedures, and orders are all accurate and complete.

## 2020-06-23 ENCOUNTER — Encounter: Payer: Self-pay | Admitting: Dermatology

## 2020-06-29 ENCOUNTER — Ambulatory Visit: Payer: Medicare Other | Admitting: Cardiovascular Disease

## 2020-06-29 ENCOUNTER — Encounter: Payer: Self-pay | Admitting: Cardiovascular Disease

## 2020-06-29 ENCOUNTER — Other Ambulatory Visit: Payer: Self-pay

## 2020-06-29 VITALS — BP 140/70 | HR 64 | Ht 65.0 in | Wt 201.0 lb

## 2020-06-29 DIAGNOSIS — I10 Essential (primary) hypertension: Secondary | ICD-10-CM

## 2020-06-29 DIAGNOSIS — Z951 Presence of aortocoronary bypass graft: Secondary | ICD-10-CM

## 2020-06-29 DIAGNOSIS — E782 Mixed hyperlipidemia: Secondary | ICD-10-CM

## 2020-06-29 NOTE — Patient Instructions (Signed)

## 2020-06-29 NOTE — Assessment & Plan Note (Signed)
History of CAD status post coronary artery bypass grafting x4 after cath performed by myself 01/07/2011 revealed left main/two-vessel disease with mild LV dysfunction.  Dr. Dahlia Byes placed a LIMA to the LAD, vein to diagonal branch, obtuse marginal branch and PDA.  His postop course was uncomplicated.  Follow-up echo performed 04/03/2011 revealed normal LV function with mild septal hypokinesia and Myoview was normal as well.  He denies chest pain or shortness of breath.

## 2020-06-29 NOTE — Assessment & Plan Note (Signed)
History of essential hypertension with blood pressure measured today at 140/70.  He is on metoprolol and losartan

## 2020-06-29 NOTE — Assessment & Plan Note (Signed)
History of hyperlipidemia on Repatha with lipid profile performed 08/25/2019 revealing a total cholesterol of 119, LDL of 64 and HDL 41.

## 2020-06-29 NOTE — Progress Notes (Signed)
06/29/2020 Derek Hayden   [birth year]  [id number]  Primary Physician Prince Solian, MD Primary Cardiologist: Lorretta Harp MD FACP, [place], [place], [place]  HPI:  Derek Hayden is a 69 y.o.  mildly overweight, divorced, Caucasian male father of 26, grandfather to 5 grandchildren who I last saw in the 06/28/2019. He is currently working at [employer] . He has a history of CAD status post cath Jan 07, 2011 revealing left main 2-vessel disease with mild LV dysfunction. He ultimately underwent coronary artery bypass grafting x4 by Dr. Tharon Aquas Trigt with a LIMA to his LAD, a vein to a diagonal branch, OM and PDA. His hospital course was uncomplicated, and he recuperated nicely. He had an echo performed April 03, 2011 that was normal with some mild septal hypokinesia and a Myoview that was normal as well.  He exercises on a daily basis doing to 200 crunches in the morning and150pushups.He also does a total body workout 3 days a week. He has also had his left ankle operated on since I saw him last and has recuperated nicely. He has bilateral rotator cuff tears and will need to have this addressed in the future as well.  Since I saw him in the office a year ago he is completely asymptomatic.  He was placed on Repatha and has an excellent lipid profile with an LDL of 64.  He is contemplating retirement.  He did have a few episodes of chest pain several weeks ago during times stress but these have resolved spontaneously.   Current Meds  Medication Sig  . Ascorbic Acid (VITAMIN C ADULT GUMMIES PO) Take by mouth daily.  Marland Kitchen aspirin 162 MG EC tablet Take 162 mg by mouth daily.    . cholecalciferol (VITAMIN D) 1000 units tablet Take 1,000 Units by mouth daily.  . Coenzyme Q10 (CO Q 10 PO) Take 1 tablet by mouth daily.  Marland Kitchen losartan (COZAAR) 25 MG tablet TAKE 1 TABLET BY MOUTH ONCE DAILY  . metoprolol tartrate (LOPRESSOR) 25 MG tablet TAKE 1/2 TABLET BY MOUTH THREE TIMES DAILY  . Multiple Vitamin  (MULTIVITAMIN PO) Take 1 tablet by mouth daily.    Marland Kitchen REPATHA SURECLICK 025 MG/ML SOAJ INJECT 140 MG INTO THE SKIN EVERY 14 (FOURTEEN) DAYS.     No Known Allergies  Social History   Socioeconomic History  . Marital status: Single    Spouse name: Not on file  . Number of children: Not on file  . Years of education: Not on file  . Highest education level: Not on file  Occupational History  . Not on file  Tobacco Use  . Smoking status: Never Smoker  . Smokeless tobacco: Never Used  Vaping Use  . Vaping Use: Never used  Substance and Sexual Activity  . Alcohol use: No  . Drug use: No  . Sexual activity: Not on file  Other Topics Concern  . Not on file  Social History Narrative  . Not on file   Social Determinants of Health   Financial Resource Strain:   . Difficulty of Paying Living Expenses: Not on file  Food Insecurity:   . Worried About Charity fundraiser in the Last Year: Not on file  . Ran Out of Food in the Last Year: Not on file  Transportation Needs:   . Lack of Transportation (Medical): Not on file  . Lack of Transportation (Non-Medical): Not on file  Physical Activity:   . Days of Exercise per  Week: Not on file  . Minutes of Exercise per Session: Not on file  Stress:   . Feeling of Stress : Not on file  Social Connections:   . Frequency of Communication with Friends and Family: Not on file  . Frequency of Social Gatherings with Friends and Family: Not on file  . Attends Religious Services: Not on file  . Active Member of Clubs or Organizations: Not on file  . Attends Archivist Meetings: Not on file  . Marital Status: Not on file  Intimate Partner Violence:   . Fear of Current or Ex-Partner: Not on file  . Emotionally Abused: Not on file  . Physically Abused: Not on file  . Sexually Abused: Not on file     Review of Systems: General: negative for chills, fever, night sweats or weight changes.  Cardiovascular: negative for chest pain,  dyspnea on exertion, edema, orthopnea, palpitations, paroxysmal nocturnal dyspnea or shortness of breath Dermatological: negative for rash Respiratory: negative for cough or wheezing Urologic: negative for hematuria Abdominal: negative for nausea, vomiting, diarrhea, bright red blood per rectum, melena, or hematemesis Neurologic: negative for visual changes, syncope, or dizziness All other systems reviewed and are otherwise negative except as noted above.    Blood pressure 140/70, pulse 64, height 5\' 5"  (1.651 m), weight 201 lb (91.2 kg), SpO2 98 %.  General appearance: alert and no distress Neck: no adenopathy, no carotid bruit, no JVD, supple, symmetrical, trachea midline and thyroid not enlarged, symmetric, no tenderness/mass/nodules Lungs: clear to auscultation bilaterally Heart: regular rate and rhythm, S1, S2 normal, no murmur, click, rub or gallop Extremities: extremities normal, atraumatic, no cyanosis or edema Pulses: 2+ and symmetric Skin: Skin color, texture, turgor normal. No rashes or lesions Neurologic: Alert and oriented X 3, normal strength and tone. Normal symmetric reflexes. Normal coordination and gait  EKG sinus rhythm at 64 with inferior Q waves and nonspecific ST and T wave changes.  Personally reviewed this EKG.  ASSESSMENT AND PLAN:   S/P CABG x 4: Jan 08, 2011,  Dr. Prescott Gum, LIMA to the LAD, SVG to diag, SVG to OM, SVG to PDA.   History of CAD status post coronary artery bypass grafting x4 after cath performed by myself 01/07/2011 revealed left main/two-vessel disease with mild LV dysfunction.  Dr. Dahlia Byes placed a LIMA to the LAD, vein to diagonal branch, obtuse marginal branch and PDA.  His postop course was uncomplicated.  Follow-up echo performed 04/03/2011 revealed normal LV function with mild septal hypokinesia and Myoview was normal as well.  He denies chest pain or shortness of breath.  HTN (hypertension) History of essential hypertension with blood  pressure measured today at 140/70.  He is on metoprolol and losartan  Hyperlipidemia History of hyperlipidemia on Repatha with lipid profile performed 08/25/2019 revealing a total cholesterol of 119, LDL of 64 and HDL 41.      Lorretta Harp MD FACP,FACC,FAHA, [facility] 06/29/2020 4:25 PM

## 2020-07-09 MED FILL — REPATHA SURECLICK 140 MG/ML: 140 | 28 days supply | Qty: 2 | Fill #1

## 2020-07-11 ENCOUNTER — Other Ambulatory Visit (HOSPITAL_COMMUNITY): Payer: Self-pay | Admitting: Registered Nurse

## 2020-07-11 MED FILL — BENZONATATE 100 MG CAPS: 100 | 14 days supply | Qty: 42 | Fill #0

## 2020-07-11 MED FILL — AZITHROMYCIN 250 MG TABLET: 250 | 5 days supply | Qty: 6 | Fill #0

## 2020-08-06 MED FILL — REPATHA SURECLICK 140 MG/ML: 140 | 28 days supply | Qty: 2 | Fill #2

## 2020-09-03 MED FILL — REPATHA SURECLICK 140 MG/ML: 140 | 28 days supply | Qty: 2 | Fill #3

## 2020-09-11 MED FILL — METOPROLOL TARTRATE 25 MG T: 25 | 90 days supply | Qty: 135 | Fill #1

## 2020-09-11 MED FILL — LOSARTAN POTASSIUM 25 MG TA: 25 | 90 days supply | Qty: 90 | Fill #1

## 2020-10-01 MED FILL — REPATHA SURECLICK 140 MG/ML: 140 | 28 days supply | Qty: 2 | Fill #4

## 2020-10-29 MED FILL — REPATHA SURECLICK 140 MG/ML: 140 | 28 days supply | Qty: 2 | Fill #5

## 2020-11-26 ENCOUNTER — Other Ambulatory Visit (HOSPITAL_COMMUNITY): Payer: Self-pay

## 2020-11-26 MED FILL — Evolocumab Subcutaneous Soln Auto-Injector 140 MG/ML: SUBCUTANEOUS | 28 days supply | Qty: 2 | Fill #0 | Status: AC

## 2020-11-27 ENCOUNTER — Other Ambulatory Visit (HOSPITAL_COMMUNITY): Payer: Self-pay

## 2020-12-06 ENCOUNTER — Other Ambulatory Visit (HOSPITAL_COMMUNITY): Payer: Self-pay

## 2020-12-06 MED FILL — Losartan Potassium Tab 25 MG: ORAL | 90 days supply | Qty: 90 | Fill #0 | Status: AC

## 2020-12-06 MED FILL — Metoprolol Tartrate Tab 25 MG: ORAL | 90 days supply | Qty: 135 | Fill #0 | Status: AC

## 2020-12-24 ENCOUNTER — Other Ambulatory Visit (HOSPITAL_COMMUNITY): Payer: Self-pay

## 2020-12-24 MED FILL — Evolocumab Subcutaneous Soln Auto-Injector 140 MG/ML: SUBCUTANEOUS | 28 days supply | Qty: 2 | Fill #1 | Status: AC

## 2021-01-22 ENCOUNTER — Other Ambulatory Visit (HOSPITAL_COMMUNITY): Payer: Self-pay

## 2021-01-22 MED FILL — Evolocumab Subcutaneous Soln Auto-Injector 140 MG/ML: SUBCUTANEOUS | 28 days supply | Qty: 2 | Fill #2 | Status: AC

## 2021-02-18 ENCOUNTER — Other Ambulatory Visit (HOSPITAL_COMMUNITY): Payer: Self-pay

## 2021-02-18 MED FILL — Evolocumab Subcutaneous Soln Auto-Injector 140 MG/ML: SUBCUTANEOUS | 28 days supply | Qty: 2 | Fill #3 | Status: AC

## 2021-03-05 ENCOUNTER — Ambulatory Visit: Payer: Medicare Other | Admitting: Dermatology

## 2021-03-05 ENCOUNTER — Other Ambulatory Visit: Payer: Self-pay

## 2021-03-05 DIAGNOSIS — Z1283 Encounter for screening for malignant neoplasm of skin: Secondary | ICD-10-CM | POA: Diagnosis not present

## 2021-03-05 DIAGNOSIS — D485 Neoplasm of uncertain behavior of skin: Secondary | ICD-10-CM

## 2021-03-05 DIAGNOSIS — D1801 Hemangioma of skin and subcutaneous tissue: Secondary | ICD-10-CM

## 2021-03-05 NOTE — Patient Instructions (Signed)

## 2021-03-07 ENCOUNTER — Other Ambulatory Visit (HOSPITAL_COMMUNITY): Payer: Self-pay

## 2021-03-07 MED FILL — Metoprolol Tartrate Tab 25 MG: ORAL | 90 days supply | Qty: 135 | Fill #1 | Status: AC

## 2021-03-07 MED FILL — Losartan Potassium Tab 25 MG: ORAL | 90 days supply | Qty: 90 | Fill #1 | Status: AC

## 2021-03-17 ENCOUNTER — Encounter: Payer: Self-pay | Admitting: Dermatology

## 2021-03-17 NOTE — Progress Notes (Signed)
   Follow-Up Visit   Subjective  EVERARD COURSON is a 70 y.o. male who presents for the following: Skin Problem (Here to check left side abdomen. Hemangioma x years but it had a dark scale on the outside of it that worried him. ).  Skin check, change in spot left abdomen Location:  Duration:  Quality:  Associated Signs/Symptoms: Modifying Factors:  Severity:  Timing: Context:   Objective  Well appearing patient in no apparent distress; mood and affect are within normal limits. Waist up skin exam.  No atypical pigmented lesions.  Left Abdomen (side) - Upper Pink crusted 5 mm papule with history of change       All skin waist up examined.   Assessment & Plan    Screening for malignant neoplasm of skin  Yearly skin exam.  Encouraged to self examine twice annually.  Continued ultraviolet protection.  Neoplasm of uncertain behavior of skin Left Abdomen (side) - Upper  Skin / nail biopsy Type of biopsy: tangential   Informed consent: discussed and consent obtained   Timeout: patient name, date of birth, surgical site, and procedure verified   Anesthesia: the lesion was anesthetized in a standard fashion   Anesthetic:  1% lidocaine w/ epinephrine 1-100,000 local infiltration Instrument used: flexible razor blade   Hemostasis achieved with: ferric subsulfate   Outcome: patient tolerated procedure well   Post-procedure details: wound care instructions given    Specimen 1 - Surgical pathology Differential Diagnosis: Hemangioma  Check Margins: No      I, Lavonna Monarch, MD, have reviewed all documentation for this visit.  The documentation on 03/17/21 for the exam, diagnosis, procedures, and orders are all accurate and complete.

## 2021-03-18 ENCOUNTER — Other Ambulatory Visit (HOSPITAL_COMMUNITY): Payer: Self-pay

## 2021-03-18 MED FILL — Evolocumab Subcutaneous Soln Auto-Injector 140 MG/ML: SUBCUTANEOUS | 28 days supply | Qty: 2 | Fill #4 | Status: AC

## 2021-03-19 ENCOUNTER — Other Ambulatory Visit (HOSPITAL_COMMUNITY): Payer: Self-pay

## 2021-04-01 ENCOUNTER — Other Ambulatory Visit (HOSPITAL_COMMUNITY): Payer: Self-pay

## 2021-04-01 MED ORDER — METHYLPREDNISOLONE 4 MG PO TABS
ORAL_TABLET | ORAL | 0 refills | Status: DC
  Filled 2021-04-01: qty 21, 6d supply, fill #0

## 2021-04-11 ENCOUNTER — Other Ambulatory Visit (HOSPITAL_COMMUNITY): Payer: Self-pay

## 2021-04-11 MED ORDER — GABAPENTIN 300 MG PO CAPS
ORAL_CAPSULE | ORAL | 0 refills | Status: DC
  Filled 2021-04-11: qty 15, 15d supply, fill #0

## 2021-04-12 ENCOUNTER — Other Ambulatory Visit (HOSPITAL_COMMUNITY): Payer: Self-pay

## 2021-04-15 ENCOUNTER — Other Ambulatory Visit (HOSPITAL_COMMUNITY): Payer: Self-pay

## 2021-04-15 MED FILL — Evolocumab Subcutaneous Soln Auto-Injector 140 MG/ML: SUBCUTANEOUS | 28 days supply | Qty: 2 | Fill #5 | Status: CN

## 2021-06-17 ENCOUNTER — Other Ambulatory Visit: Payer: Self-pay | Admitting: Cardiovascular Disease

## 2021-06-17 ENCOUNTER — Other Ambulatory Visit (HOSPITAL_COMMUNITY): Payer: Self-pay

## 2021-06-18 ENCOUNTER — Other Ambulatory Visit (HOSPITAL_COMMUNITY): Payer: Self-pay

## 2021-06-18 ENCOUNTER — Other Ambulatory Visit: Payer: Self-pay | Admitting: Cardiovascular Disease

## 2021-06-18 MED FILL — Losartan Potassium Tab 25 MG: ORAL | 90 days supply | Qty: 90 | Fill #0 | Status: AC

## 2021-06-18 MED FILL — Metoprolol Tartrate Tab 25 MG: ORAL | 90 days supply | Qty: 135 | Fill #0 | Status: AC

## 2021-06-28 ENCOUNTER — Encounter: Payer: Self-pay | Admitting: Cardiovascular Disease

## 2021-06-28 ENCOUNTER — Telehealth: Payer: Self-pay | Admitting: Pharmacist

## 2021-06-28 ENCOUNTER — Ambulatory Visit: Payer: Medicare Other | Admitting: Cardiovascular Disease

## 2021-06-28 ENCOUNTER — Other Ambulatory Visit: Payer: Self-pay

## 2021-06-28 DIAGNOSIS — I2510 Atherosclerotic heart disease of native coronary artery without angina pectoris: Secondary | ICD-10-CM

## 2021-06-28 DIAGNOSIS — Z951 Presence of aortocoronary bypass graft: Secondary | ICD-10-CM

## 2021-06-28 DIAGNOSIS — I10 Essential (primary) hypertension: Secondary | ICD-10-CM

## 2021-06-28 DIAGNOSIS — E782 Mixed hyperlipidemia: Secondary | ICD-10-CM

## 2021-06-28 MED ORDER — REPATHA SURECLICK 140 MG/ML ~~LOC~~ SOAJ: 1.0000 mL | SUBCUTANEOUS | 0 refills | Status: DC

## 2021-06-28 NOTE — Progress Notes (Addendum)
12/27/2021 Derek Hayden   Feb 05, 1951  [id number]  Primary Physician Prince Solian, MD Primary Cardiologist: Lorretta Harp MD FACP, [place], [place], [place]  HPI:  Derek Hayden is a 70 y.o.  mildly overweight, divorced, Caucasian male father of 34, grandfather to 5 grandchildren who I last saw in the 06/29/2020. He is currently working at [employer] . He has a history of CAD status post cath Jan 07, 2011 revealing left main 2-vessel disease with mild LV dysfunction. He ultimately underwent coronary artery bypass grafting x4 by Dr. Tharon Aquas Trigt with a LIMA to his LAD, a vein to a diagonal branch, OM and PDA. His hospital course was uncomplicated, and he recuperated nicely. He had an echo performed April 03, 2011 that was normal with some mild septal hypokinesia and a Myoview that was normal as well.    He exercises on a daily basis doing to 200 crunches in the morning and 150 pushups.  He also does a total body workout 3 days a week.  He has also had his left ankle operated on since I saw him last and has recuperated nicely. He has bilateral rotator cuff tears and will need to have this addressed in the future as well.   Since I saw him in the office a year ago he is completely asymptomatic.  He was placed on Repatha and has an excellent lipid profile with an LDL of 64.  He was contemplating retirement.  Unfortunately, because of cost he discontinued Repatha.  Current Meds  Medication Sig   Ascorbic Acid (VITAMIN C ADULT GUMMIES PO) Take by mouth daily.   Aspirin 81 MG CAPS Take 162 mg by mouth daily.     cholecalciferol (VITAMIN D) 1000 units tablet Take 1,000 Units by mouth daily.   Coenzyme Q10 (CO Q 10 PO) Take 1 tablet by mouth daily.   losartan (COZAAR) 25 MG tablet TAKE 1 TABLET BY MOUTH ONCE DAILY   metoprolol tartrate (LOPRESSOR) 25 MG tablet TAKE 1/2 TABLET BY MOUTH THREE TIMES DAILY   Multiple Vitamin (MULTIVITAMIN PO) Take 1 tablet by mouth daily.       No Known  Allergies  Social History   Socioeconomic History   Marital status: Single    Spouse name: Not on file   Number of children: Not on file   Years of education: Not on file   Highest education level: Not on file  Occupational History   Not on file  Tobacco Use   Smoking status: Never   Smokeless tobacco: Never  Vaping Use   Vaping Use: Never used  Substance and Sexual Activity   Alcohol use: No   Drug use: No   Sexual activity: Not on file  Other Topics Concern   Not on file  Social History Narrative   Not on file   Social Determinants of Health   Financial Resource Strain: Not on file  Food Insecurity: Not on file  Transportation Needs: Not on file  Physical Activity: Not on file  Stress: Not on file  Social Connections: Not on file  Intimate Partner Violence: Not on file     Review of Systems: General: negative for chills, fever, night sweats or weight changes.  Cardiovascular: negative for chest pain, dyspnea on exertion, edema, orthopnea, palpitations, paroxysmal nocturnal dyspnea or shortness of breath Dermatological: negative for rash Respiratory: negative for cough or wheezing Urologic: negative for hematuria Abdominal: negative for nausea, vomiting, diarrhea, bright red blood per rectum, melena,  or hematemesis Neurologic: negative for visual changes, syncope, or dizziness All other systems reviewed and are otherwise negative except as noted above.    Blood pressure 140/86, pulse 65, height 5\' 5"  (1.651 m), weight 197 lb 6.4 oz (89.5 kg), SpO2 97 %.  General appearance: alert and no distress Neck: no adenopathy, no carotid bruit, no JVD, supple, symmetrical, trachea midline, and thyroid not enlarged, symmetric, no tenderness/mass/nodules Lungs: clear to auscultation bilaterally Heart: regular rate and rhythm, S1, S2 normal, no murmur, click, rub or gallop Extremities: extremities normal, atraumatic, no cyanosis or edema Pulses: 2+ and symmetric Skin: Skin  color, texture, turgor normal. No rashes or lesions Neurologic: Grossly normal  EKG sinus rhythm at 65 without ST or T wave changes.  Personally reviewed this EKG.  ASSESSMENT AND PLAN:   S/P CABG x 4: Jan 08, 2011,  Dr. Prescott Gum, LIMA to the LAD, SVG to diag, SVG to OM, SVG to PDA.   History of CAD status post cardiac catheterization which I performed 01/07/2011 revealing left main/two-vessel disease with mild LV dysfunction.  He also underwent CABG x4 by Dr. Darcey Nora with a LIMA to his LAD, vein to diagonal branch, obtuse marginal branch and PDA.  He is very active and denies chest pain or shortness of breath.  HTN (hypertension) History of essential hypertension blood pressure measured today at 140/86.  He is on losartan and metoprolol.  Hyperlipidemia History of hyperlipidemia on Repatha in the past which he stopped because of cost.  His most recent lipid profile performed 08/23/2020 revealed total cholesterol 123, LDL 58 and HDL 42.  Addendum: Lipid profile performed 09/30/2021 revealed total cholesterol of 125, LDL 65 and HDL 45.     Lorretta Harp MD FACP,FACC,FAHA, [facility] 12/27/2021 4:48 PM

## 2021-06-28 NOTE — Assessment & Plan Note (Addendum)
History of hyperlipidemia on Repatha in the past which he stopped because of cost.  His most recent lipid profile performed 08/23/2020 revealed total cholesterol 123, LDL 58 and HDL 42.  Addendum: Lipid profile performed 09/30/2021 revealed total cholesterol of 125, LDL 65 and HDL 45.

## 2021-06-28 NOTE — Assessment & Plan Note (Signed)
History of CAD status post cardiac catheterization which I performed 01/07/2011 revealing left main/two-vessel disease with mild LV dysfunction.  He also underwent CABG x4 by Dr. Darcey Nora with a LIMA to his LAD, vein to diagonal branch, obtuse marginal branch and PDA.  He is very active and denies chest pain or shortness of breath.

## 2021-06-28 NOTE — Patient Instructions (Signed)
Medication Instructions:  Your physician recommends that you continue on your current medications as directed. Please refer to the Current Medication list given to you today.  *If you need a refill on your cardiac medications before your next appointment, please call your pharmacy*   Lab Work: Your physician recommends that you return for lab work in: next week or so for FASTING lipid/liver profile.  If you have labs (blood work) drawn today and your tests are completely normal, you will receive your results only by: [place] (if you have MyChart) OR A paper copy in the mail If you have any lab test that is abnormal or we need to change your treatment, we will call you to review the results.   Follow-Up: At [facility], you and your health needs are our priority.  As part of our continuing mission to provide you with exceptional heart care, we have created designated Provider Care Teams.  These Care Teams include your primary Cardiologist (physician) and Advanced Practice Providers (APPs -  Physician Assistants and Nurse Practitioners) who all work together to provide you with the care you need, when you need it.  We recommend signing up for the patient portal called "MyChart".  Sign up information is provided on this After Visit Summary.  MyChart is used to connect with patients for Virtual Visits (Telemedicine).  Patients are able to view lab/test results, encounter notes, upcoming appointments, etc.  Non-urgent messages can be sent to your provider as well.   To learn more about what you can do with MyChart, go to [email or web address].    Your next appointment:   12 month(s)  The format for your next appointment:   In Person  Provider:   Quay Burow, MD

## 2021-06-28 NOTE — Assessment & Plan Note (Signed)
History of essential hypertension blood pressure measured today at 140/86.  He is on losartan and metoprolol.

## 2021-06-28 NOTE — Telephone Encounter (Signed)
Gave 2 samples of Repatha

## 2021-07-23 LAB — HEPATIC FUNCTION PANEL
ALT: 24 IU/L (ref 0–44)
AST: 25 IU/L (ref 0–40)
Albumin: 4.5 g/dL (ref 3.8–4.8)
Alkaline Phosphatase: 72 IU/L (ref 44–121)
Bilirubin Total: 0.4 mg/dL (ref 0.0–1.2)
Bilirubin, Direct: 0.12 mg/dL (ref 0.00–0.40)
Total Protein: 6.3 g/dL (ref 6.0–8.5)

## 2021-07-23 LAB — LIPID PANEL
Chol/HDL Ratio: 4.1 ratio (ref 0.0–5.0)
Cholesterol, Total: 201 mg/dL — ABNORMAL HIGH (ref 100–199)
HDL: 49 mg/dL (ref >39)
LDL Chol Calc (NIH): 137 mg/dL — ABNORMAL HIGH (ref 0–99)
Triglycerides: 85 mg/dL (ref 0–149)
VLDL Cholesterol Cal: 15 mg/dL (ref 5–40)

## 2021-08-26 ENCOUNTER — Other Ambulatory Visit (HOSPITAL_COMMUNITY): Payer: Self-pay

## 2021-08-26 ENCOUNTER — Other Ambulatory Visit: Payer: Self-pay | Admitting: Cardiovascular Disease

## 2021-08-26 DIAGNOSIS — E782 Mixed hyperlipidemia: Secondary | ICD-10-CM

## 2021-08-26 DIAGNOSIS — I2510 Atherosclerotic heart disease of native coronary artery without angina pectoris: Secondary | ICD-10-CM

## 2021-08-26 MED FILL — Losartan Potassium Tab 25 MG: ORAL | 90 days supply | Qty: 90 | Fill #1 | Status: CN

## 2021-08-27 ENCOUNTER — Other Ambulatory Visit (HOSPITAL_COMMUNITY): Payer: Self-pay

## 2021-08-27 MED ORDER — REPATHA SURECLICK 140 MG/ML ~~LOC~~ SOAJ
SUBCUTANEOUS | 1 refills | Status: DC
  Filled 2021-08-27: qty 6, 84d supply, fill #0
  Filled 2021-11-09: qty 6, 84d supply, fill #1

## 2021-10-07 ENCOUNTER — Other Ambulatory Visit (HOSPITAL_COMMUNITY): Payer: Self-pay

## 2021-10-07 MED ORDER — METHOCARBAMOL 750 MG PO TABS
ORAL_TABLET | ORAL | 1 refills | Status: DC
  Filled 2021-10-07: qty 30, 10d supply, fill #0

## 2021-10-07 MED FILL — Losartan Potassium Tab 25 MG: ORAL | 90 days supply | Qty: 90 | Fill #1 | Status: AC

## 2021-10-07 MED FILL — Metoprolol Tartrate Tab 25 MG: ORAL | 90 days supply | Qty: 135 | Fill #1 | Status: AC

## 2021-10-10 ENCOUNTER — Other Ambulatory Visit (HOSPITAL_COMMUNITY): Payer: Self-pay

## 2021-10-10 MED ORDER — PREDNISONE 10 MG PO TABS
ORAL_TABLET | ORAL | 0 refills | Status: DC
  Filled 2021-10-10: qty 21, 12d supply, fill #0

## 2021-10-10 MED ORDER — INDOMETHACIN 25 MG PO CAPS
ORAL_CAPSULE | ORAL | 0 refills | Status: DC
  Filled 2021-10-10: qty 60, 30d supply, fill #0

## 2021-10-11 ENCOUNTER — Other Ambulatory Visit (HOSPITAL_COMMUNITY): Payer: Self-pay

## 2021-11-09 ENCOUNTER — Other Ambulatory Visit (HOSPITAL_COMMUNITY): Payer: Self-pay

## 2021-11-11 ENCOUNTER — Other Ambulatory Visit (HOSPITAL_COMMUNITY): Payer: Self-pay

## 2021-11-27 ENCOUNTER — Telehealth: Payer: Self-pay

## 2021-11-27 NOTE — Telephone Encounter (Signed)
? ?  Pre-operative Risk Assessment  ?  ?Patient Name: Derek Hayden  ?DOB: 1951/07/16 ?MRN: [id number]  ? ?  ? ?Request for Surgical Clearance   ? ?Procedure:   right total hip arthoplasty ? ?Date of Surgery:  Clearance 01/14/22                              ?   ?Surgeon:  Paralee Cancel ?Surgeon's Group or Practice Name:  [facility] ?Phone number:  [phone] ?Fax number:  [phone] ?  ?Type of Clearance Requested:   ?- Pharmacy:  Hold Aspirin 7 days prior to procedure ?  ?Type of Anesthesia:  Spinal ?  ?Additional requests/questions:   please perform general physical, CBC, BMP, albumin, TP/INR, hbA1c, and urinalysis . If indicated: EKG therpeutic drug levels, chest x-ray, and other tests you determine necessary for surgical clearance. ? ?Signed, ?Kathreen Devoid   ?11/27/2021, 7:33 AM  ? ?

## 2021-11-27 NOTE — Telephone Encounter (Signed)
? ?  Patient Name: Derek Hayden  ?DOB: 01-23-1951 ?MRN: [id number] ? ?Primary Cardiologist: Quay Burow, MD ? ?Chart reviewed as part of pre-operative protocol coverage.  ? ?71 year old male with a history of CAD s/p CABG x4 in 2012, hypertension, hyperlipidemia. ? ?He was last seen in the office on 06/28/2021 and was doing well from a cardiac standpoint.  He was very active at the time and denied symptoms concerning for angina. ? ?Received surgical clearance request for upcoming right total hip arthroplasty scheduled for 01/14/2022. ? ?Dr. Gwenlyn Found, please advise on holding aspirin 7 days prior to procedure. ? ?Please route your response to CV DIV PREOP.  ? ?Thank you.  ? ?Lenna Sciara, NP ?11/27/2021, 1:57 PM ? ? ?

## 2021-12-06 NOTE — Telephone Encounter (Signed)
Okay to hold aspirin for 7 days before the procedure. ?

## 2021-12-06 NOTE — Telephone Encounter (Signed)
? ? ?  Name: Derek Hayden  ?DOB: [birth year]  ?MRN: [id number] ? ?Primary Cardiologist: Quay Burow, MD ? ? ?Preoperative team, please contact this patient and set up a phone call appointment for further preoperative risk assessment. Please obtain consent and complete medication review. Thank you for your help. ? ? ?Per Dr. Sallyanne Kuster, covering for Dr. Gwenlyn Found, Okay to hold aspirin for 7 days before the procedure. ? ?Ledora Bottcher, PA-C ?12/06/2021, 5:37 PM ?[phone] ?[place] ?[address] ?[place], [place] [address] ? ? ?

## 2021-12-09 ENCOUNTER — Telehealth: Payer: Self-pay

## 2021-12-09 NOTE — Telephone Encounter (Signed)
?Patient Consent for Virtual Visit  ? ? ? ?Mr. Derek Hayden, you are scheduled for a virtual visit with your provider 12/17/2021. ?  Just as we do with appointments in the office, we must obtain your consent to participate.  Your consent will be active for this visit and any virtual visit you may have with one of our providers in the next 365 days.  If you have a MyChart account, I can also send a copy of this consent to you electronically.  All virtual visits are billed to your insurance company just like a traditional visit in the office.  As this is a virtual visit, video technology does not allow for your provider to perform a traditional examination.  This may limit your provider's ability to fully assess your condition.  If your provider identifies any concerns that need to be evaluated in person or the need to arrange testing such as labs, EKG, etc, we will make arrangements to do so.  Although advances in technology are sophisticated, we cannot ensure that it will always work on either your end or our end.  If the connection with a video visit is poor, we may have to switch to a telephone visit.  With either a video or telephone visit, we are not always able to ensure that we have a secure connection.   I need to obtain your verbal consent now.   Are you willing to proceed with your visit on 12/17/2021?  ?       ?[name] has provided verbal consent on 12/09/2021 for a virtual visit (video or telephone). ? ? ?CONSENT FOR VIRTUAL VISIT FOR:  Derek Hayden  ?By participating in this virtual visit I agree to the following: ? ?I hereby voluntarily request, consent and authorize [facility] and its employed or contracted physicians, physician assistants, nurse practitioners or other licensed health care professionals (the Practitioner), to provide me with telemedicine health care services (the ?Services") as deemed necessary by the treating Practitioner. I acknowledge and consent to receive the Services by  the Practitioner via telemedicine. I understand that the telemedicine visit will involve communicating with the Practitioner through live audiovisual communication technology and the disclosure of certain medical information by electronic transmission. I acknowledge that I have been given the opportunity to request an in-person assessment or other available alternative prior to the telemedicine visit and am voluntarily participating in the telemedicine visit. ? ?I understand that I have the right to withhold or withdraw my consent to the use of telemedicine in the course of my care at any time, without affecting my right to future care or treatment, and that the Practitioner or I may terminate the telemedicine visit at any time. I understand that I have the right to inspect all information obtained and/or recorded in the course of the telemedicine visit and may receive copies of available information for a reasonable fee.  I understand that some of the potential risks of receiving the Services via telemedicine include:  ?Delay or interruption in medical evaluation due to technological equipment failure or disruption; ?Information transmitted may not be sufficient (e.g. poor resolution of images) to allow for appropriate medical decision making by the Practitioner; and/or  ?In rare instances, security protocols could fail, causing a breach of personal health information. ? ?Furthermore, I acknowledge that it is my responsibility to provide information about my medical history, conditions and care that is complete and accurate to the best of my ability. I acknowledge that Practitioner's advice,  recommendations, and/or decision may be based on factors not within their control, such as incomplete or inaccurate data provided by me or distortions of diagnostic images or specimens that may result from electronic transmissions. I understand that the practice of medicine is not an exact science and that Practitioner makes no  warranties or guarantees regarding treatment outcomes. I acknowledge that a copy of this consent can be made available to me via my patient portal ([facility]), or I can request a printed copy by calling the office of [facility].   ? ?I understand that my insurance will be billed for this visit.  ? ?I have read or had this consent read to me. ?I understand the contents of this consent, which adequately explains the benefits and risks of the Services being provided via telemedicine.  ?I have been provided ample opportunity to ask questions regarding this consent and the Services and have had my questions answered to my satisfaction. ?I give my informed consent for the services to be provided through the use of telemedicine in my medical care ? ? ? ?

## 2021-12-17 ENCOUNTER — Ambulatory Visit: Payer: Medicare Other | Admitting: Physician Assistant

## 2021-12-17 DIAGNOSIS — I2510 Atherosclerotic heart disease of native coronary artery without angina pectoris: Secondary | ICD-10-CM | POA: Diagnosis not present

## 2021-12-17 DIAGNOSIS — Z01810 Encounter for preprocedural cardiovascular examination: Secondary | ICD-10-CM | POA: Diagnosis not present
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
## 2021-12-30 ENCOUNTER — Other Ambulatory Visit (HOSPITAL_COMMUNITY): Payer: Self-pay

## 2022-01-01 NOTE — Patient Instructions (Addendum)
DUE TO COVID-19 ONLY TWO VISITORS  (aged 69 and older)  ARE ALLOWED TO COME WITH YOU AND STAY IN THE WAITING ROOM ONLY DURING PRE OP AND PROCEDURE.   ?**NO VISITORS ARE ALLOWED IN THE SHORT STAY AREA OR RECOVERY ROOM!!** ? ?IF YOU WILL BE ADMITTED INTO THE HOSPITAL YOU ARE ALLOWED ONLY FOUR SUPPORT PEOPLE DURING VISITATION HOURS ONLY (7 AM -8PM)   ?The support person(s) must pass our screening, gel in and out, and wear a mask at all times, including in the patient?s room. ?Patients must also wear a mask when staff or their support person are in the room. ?Visitors GUEST BADGE MUST BE WORN VISIBLY  ?One adult visitor may remain with you overnight and MUST be in the room by 8 P.M. ?  ? ? Your procedure is scheduled on: 01/14/22 ? ? Report to [facility] Main Entrance ? ?  Report to admitting at : 8:45 AM ? ? Call this number if you have problems the morning of surgery [phone] ? ? Do not eat food :After Midnight. ? ? After Midnight you may have the following liquids until : 8:30 AM  DAY OF SURGERY ? ?Water ?Black Coffee (sugar ok, NO MILK/CREAM OR CREAMERS)  ?Tea (sugar ok, NO MILK/CREAM OR CREAMERS) regular and decaf                             ?Plain Jell-O (NO RED)                                           ?Fruit ices (not with fruit pulp, NO RED)                                     ?Popsicles (NO RED)                                                                  ?Juice: apple, WHITE grape, WHITE cranberry ?Sports drinks like Gatorade (NO RED) ?Clear broth(vegetable,chicken,beef) ? ?Drink Ensure drink AT: 8:30 AM the day of surgery.   ?  ?The day of surgery:  ?Drink ONE (1) Pre-Surgery Clear Ensure or G2 at AM the morning of surgery. Drink in one sitting. Do not sip.  ?This drink was given to you during your hospital  ?pre-op appointment visit. ?Nothing else to drink after completing the  ?Pre-Surgery Clear Ensure or G2. ?  ?       If you have questions, please contact your surgeon?s office  ?  ?Oral  Hygiene is also important to reduce your risk of infection.                                    ?Remember - BRUSH YOUR TEETH THE MORNING OF SURGERY WITH YOUR REGULAR TOOTHPASTE ? ? Do NOT smoke after Midnight ? ? Take these medicines the morning of surgery with A SIP OF WATER: metoprolol. ? ?DO NOT TAKE ANY ORAL  DIABETIC MEDICATIONS DAY OF YOUR SURGERY ? ?Bring CPAP mask and tubing day of surgery. ?                  ?           You may not have any metal on your body including hair pins, jewelry, and body piercing ? ?           Do not wear lotions, powders, perfumes/cologne, or deodorant ? ?            Men may shave face and neck. ? ? Do not bring valuables to the hospital. [place] NOT ?            RESPONSIBLE   FOR VALUABLES. ? ? Contacts, dentures or bridgework may not be worn into surgery. ? ? Bring small overnight bag day of surgery. ?  ? Patients discharged on the day of surgery will not be allowed to drive home.  Someone NEEDS to stay with you for the first 24 hours after anesthesia. ? ? Special Instructions: Bring a copy of your healthcare power of attorney and living will documents         the day of surgery if you haven't scanned them before. ? ?            Please read over the following fact sheets you were given: IF [place] [phone] ? ?   [place] - Preparing for Surgery ?Before surgery, you can play an important role.  Because skin is not sterile, your skin needs to be as free of germs as possible.  You can reduce the number of germs on your skin by washing with CHG (chlorahexidine gluconate) soap before surgery.  CHG is an antiseptic cleaner which kills germs and bonds with the skin to continue killing germs even after washing. ?Please DO NOT use if you have an allergy to CHG or antibacterial soaps.  If your skin becomes reddened/irritated stop using the CHG and inform your nurse when you arrive at Short Stay. ?Do not shave (including  legs and underarms) for at least 48 hours prior to the first CHG shower.  You may shave your face/neck. ?Please follow these instructions carefully: ? 1.  Shower with CHG Soap the night before surgery and the  morning of Surgery. ? 2.  If you choose to wash your hair, wash your hair first as usual with your  normal  shampoo. ? 3.  After you shampoo, rinse your hair and body thoroughly to remove the  shampoo.                           4.  Use CHG as you would any other liquid soap.  You can apply chg directly  to the skin and wash  ?                     Gently with a scrungie or clean washcloth. ? 5.  Apply the CHG Soap to your body ONLY FROM THE NECK DOWN.   Do not use on face/ open      ?                     Wound or open sores. Avoid contact with eyes, ears mouth and genitals (private parts).  ?  Wash face,  Genitals (private parts) with your normal soap. ?            6.  Wash thoroughly, paying special attention to the area where your surgery  will be performed. ? 7.  Thoroughly rinse your body with warm water from the neck down. ? 8.  DO NOT shower/wash with your normal soap after using and rinsing off  the CHG Soap. ?               9.  Pat yourself dry with a clean towel. ?           10.  Wear clean pajamas. ?           11.  Place clean sheets on your bed the night of your first shower and do not  sleep with pets. ?Day of Surgery : ?Do not apply any lotions/deodorants the morning of surgery.  Please wear clean clothes to the hospital/surgery center. ? ?FAILURE TO FOLLOW THESE INSTRUCTIONS MAY RESULT IN THE CANCELLATION OF YOUR SURGERY ?PATIENT SIGNATURE_________________________________ ? ?NURSE SIGNATURE__________________________________ ? ?________________________________________________________________________  ? ?Incentive Spirometer ? ?An incentive spirometer is a tool that can help keep your lungs clear and active. This tool measures how well you are filling your lungs with each breath.  Taking long deep breaths may help reverse or decrease the chance of developing breathing (pulmonary) problems (especially infection) following: ?A long period of time when you are unable to move or be active. ?BEFORE THE PROCEDURE  ?If the spirometer includes an indicator to show your best effort, your nurse or respiratory therapist will set it to a desired goal. ?If possible, sit up straight or lean slightly forward. Try not to slouch. ?Hold the incentive spirometer in an upright position. ?INSTRUCTIONS FOR USE  ?Sit on the edge of your bed if possible, or sit up as far as you can in bed or on a chair. ?Hold the incentive spirometer in an upright position. ?Breathe out normally. ?Place the mouthpiece in your mouth and seal your lips tightly around it. ?Breathe in slowly and as deeply as possible, raising the piston or the ball toward the top of the column. ?Hold your breath for 3-5 seconds or for as long as possible. Allow the piston or ball to fall to the bottom of the column. ?Remove the mouthpiece from your mouth and breathe out normally. ?Rest for a few seconds and repeat Steps 1 through 7 at least 10 times every 1-2 hours when you are awake. Take your time and take a few normal breaths between deep breaths. ?The spirometer may include an indicator to show your best effort. Use the indicator as a goal to work toward during each repetition. ?After each set of 10 deep breaths, practice coughing to be sure your lungs are clear. If you have an incision (the cut made at the time of surgery), support your incision when coughing by placing a pillow or rolled up towels firmly against it. ?Once you are able to get out of bed, walk around indoors and cough well. You may stop using the incentive spirometer when instructed by your caregiver.  ?RISKS AND COMPLICATIONS ?Take your time so you do not get dizzy or light-headed. ?If you are in pain, you may need to take or ask for pain medication before doing incentive  spirometry. It is harder to take a deep breath if you are having pain. ?AFTER USE ?Rest and breathe slowly and easily. ?It can be helpful to keep track  of a log of your progress. Your caregiver can provide you

## 2022-01-03 ENCOUNTER — Encounter (HOSPITAL_COMMUNITY): Payer: Self-pay

## 2022-01-03 ENCOUNTER — Encounter (HOSPITAL_COMMUNITY)
Admission: RE | Admit: 2022-01-03 | Discharge: 2022-01-03 | Disposition: A | Discharge status: Home / Self Care | Payer: Medicare Other | Source: Ambulatory Visit | Attending: Orthopedic Surgery | Admitting: Orthopedic Surgery

## 2022-01-03 VITALS — BP 132/86 | HR 69 | Temp 98.4°F | Ht 65.0 in | Wt 193.0 lb

## 2022-01-03 DIAGNOSIS — Z01818 Encounter for other preprocedural examination: Secondary | ICD-10-CM

## 2022-01-03 DIAGNOSIS — I2510 Atherosclerotic heart disease of native coronary artery without angina pectoris: Secondary | ICD-10-CM | POA: Insufficient documentation

## 2022-01-03 DIAGNOSIS — Z01812 Encounter for preprocedural laboratory examination: Secondary | ICD-10-CM | POA: Insufficient documentation

## 2022-01-03 DIAGNOSIS — M1611 Unilateral primary osteoarthritis, right hip: Secondary | ICD-10-CM | POA: Diagnosis not present

## 2022-01-03 DIAGNOSIS — I10 Essential (primary) hypertension: Secondary | ICD-10-CM | POA: Insufficient documentation

## 2022-01-03 DIAGNOSIS — Z951 Presence of aortocoronary bypass graft: Secondary | ICD-10-CM | POA: Insufficient documentation

## 2022-01-03 HISTORY — DX: Unspecified osteoarthritis, unspecified site: M19.90

## 2022-01-03 LAB — TYPE AND SCREEN
ABO/RH(D): A POS
Antibody Screen: NEGATIVE

## 2022-01-03 LAB — CBC
HCT: 37 % — ABNORMAL LOW (ref 39.0–52.0)
Hemoglobin: 12.6 g/dL — ABNORMAL LOW (ref 13.0–17.0)
MCH: 32.6 pg (ref 26.0–34.0)
MCHC: 34.1 g/dL (ref 30.0–36.0)
MCV: 95.9 fL (ref 80.0–100.0)
Platelets: 171 10*3/uL (ref 150–400)
RBC: 3.86 MIL/uL — ABNORMAL LOW (ref 4.22–5.81)
RDW: 14.1 % (ref 11.5–15.5)
WBC: 4.2 10*3/uL (ref 4.0–10.5)
nRBC: 0 % (ref 0.0–0.2)

## 2022-01-03 LAB — BASIC METABOLIC PANEL
Anion gap: 6 (ref 5–15)
BUN: 16 mg/dL (ref 8–23)
CO2: 26 mmol/L (ref 22–32)
Calcium: 8.7 mg/dL — ABNORMAL LOW (ref 8.9–10.3)
Chloride: 109 mmol/L (ref 98–111)
Creatinine, Ser: 0.95 mg/dL (ref 0.61–1.24)
GFR, Estimated: >60 mL/min (ref >60)
Glucose, Bld: 97 mg/dL (ref 70–99)
Potassium: 4.1 mmol/L (ref 3.5–5.1)
Sodium: 141 mmol/L (ref 135–145)

## 2022-01-03 LAB — SURGICAL PCR SCREEN
MRSA, PCR: NEGATIVE
Staphylococcus aureus: POSITIVE — AB

## 2022-01-03 NOTE — Progress Notes (Signed)
For Short Stay: ?[place] appointment date: ?Date of COVID positive in last 90 days: ? ?Bowel Prep reminder: ? ? ?For Anesthesia: ?PCP - Dr. Prince Solian.: Clearance: 11/26/21 ?Cardiologist - Dr. Quay Burow. Clearance: Bhavinkumar Bhagat: PA: 12/17/21: Epic/Chart. ? ?Chest x-ray -  ?EKG - 06/28/21 ?Stress Test -  ?ECHO - 04/03/11 ?Cardiac Cath -  ?Pacemaker/ICD device last checked: ?Pacemaker orders received: ?Device Rep notified: ? ?Spinal Cord Stimulator: ? ?Sleep Study -  ?CPAP -  ? ?Fasting Blood Sugar -  ?Checks Blood Sugar _____ times a day ?Date and result of last Hgb A1c- ? ?Blood Thinner Instructions: ?Aspirin Instructions: ?Last Dose: ? ?Activity level: Can go up a flight of stairs and activities of daily living without stopping and without chest pain and/or shortness of breath ?  Able to exercise without chest pain and/or shortness of breath ?  Unable to go up a flight of stairs without chest pain and/or shortness of breath ?   ? ?Anesthesia review: Hx: HTN,CAD. ? ?Patient denies shortness of breath, fever, cough and chest pain at PAT appointment ? ? ?Patient verbalized understanding of instructions that were given to them at the PAT appointment. Patient was also instructed that they will need to review over the PAT instructions again at home before surgery.  ?

## 2022-01-06 ENCOUNTER — Other Ambulatory Visit (HOSPITAL_COMMUNITY): Payer: Self-pay

## 2022-01-06 MED FILL — Metoprolol Tartrate Tab 25 MG: ORAL | 90 days supply | Qty: 135 | Fill #2 | Status: AC

## 2022-01-06 MED FILL — Losartan Potassium Tab 25 MG: ORAL | 90 days supply | Qty: 90 | Fill #2 | Status: AC
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
## 2022-01-06 NOTE — Progress Notes (Signed)
PCR: + STAPH °
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
## 2022-01-14 ENCOUNTER — Ambulatory Visit (HOSPITAL_BASED_OUTPATIENT_CLINIC_OR_DEPARTMENT_OTHER): Payer: Medicare Other | Admitting: Certified Registered"

## 2022-01-14 ENCOUNTER — Encounter (HOSPITAL_COMMUNITY): Payer: Self-pay | Admitting: Orthopedic Surgery

## 2022-01-14 ENCOUNTER — Encounter (HOSPITAL_COMMUNITY)
Admission: RE | Disposition: A | Discharge status: Home / Self Care | Payer: Self-pay | Source: Home / Self Care | Attending: Orthopedic Surgery

## 2022-01-14 ENCOUNTER — Ambulatory Visit (HOSPITAL_COMMUNITY): Payer: Medicare Other | Admitting: Physician Assistant

## 2022-01-14 ENCOUNTER — Observation Stay (HOSPITAL_COMMUNITY)
Admission: RE | Admit: 2022-01-14 | Discharge: 2022-01-15 | Disposition: A | Discharge status: Home / Self Care | Payer: Medicare Other | Attending: Orthopedic Surgery | Admitting: Orthopedic Surgery

## 2022-01-14 ENCOUNTER — Other Ambulatory Visit: Payer: Self-pay

## 2022-01-14 ENCOUNTER — Observation Stay (HOSPITAL_COMMUNITY): Payer: Medicare Other

## 2022-01-14 ENCOUNTER — Ambulatory Visit (HOSPITAL_COMMUNITY): Payer: Medicare Other

## 2022-01-14 DIAGNOSIS — M1612 Unilateral primary osteoarthritis, left hip: Secondary | ICD-10-CM

## 2022-01-14 DIAGNOSIS — I10 Essential (primary) hypertension: Secondary | ICD-10-CM | POA: Diagnosis not present

## 2022-01-14 DIAGNOSIS — Z85828 Personal history of other malignant neoplasm of skin: Secondary | ICD-10-CM | POA: Insufficient documentation

## 2022-01-14 DIAGNOSIS — Z951 Presence of aortocoronary bypass graft: Secondary | ICD-10-CM | POA: Insufficient documentation

## 2022-01-14 DIAGNOSIS — M1611 Unilateral primary osteoarthritis, right hip: Principal | ICD-10-CM | POA: Insufficient documentation

## 2022-01-14 DIAGNOSIS — Z7982 Long term (current) use of aspirin: Secondary | ICD-10-CM | POA: Insufficient documentation

## 2022-01-14 DIAGNOSIS — I2510 Atherosclerotic heart disease of native coronary artery without angina pectoris: Secondary | ICD-10-CM | POA: Insufficient documentation

## 2022-01-14 DIAGNOSIS — Z96641 Presence of right artificial hip joint: Secondary | ICD-10-CM

## 2022-01-14 DIAGNOSIS — Z79899 Other long term (current) drug therapy: Secondary | ICD-10-CM | POA: Insufficient documentation

## 2022-01-14 HISTORY — PX: TOTAL HIP ARTHROPLASTY: SHX124

## 2022-01-14 SURGERY — ARTHROPLASTY, HIP, TOTAL, ANTERIOR APPROACH
Anesthesia: Spinal | Site: Hip | Laterality: Right

## 2022-01-14 MED ORDER — LOSARTAN POTASSIUM 25 MG PO TABS
25.0000 mg | ORAL_TABLET | Freq: Every day | ORAL | Status: DC
  Administered 2022-01-15: 25 mg via ORAL
  Filled 2022-01-14: qty 1

## 2022-01-14 MED ORDER — STERILE WATER FOR IRRIGATION IR SOLN
Status: DC | PRN
  Administered 2022-01-14: 2000 mL

## 2022-01-14 MED ORDER — PHENYLEPHRINE 80 MCG/ML (10ML) SYRINGE FOR IV PUSH (FOR BLOOD PRESSURE SUPPORT)
PREFILLED_SYRINGE | INTRAVENOUS | Status: DC | PRN
  Administered 2022-01-14: 80 ug via INTRAVENOUS

## 2022-01-14 MED ORDER — METHOCARBAMOL 500 MG IVPB - SIMPLE MED: 500.0000 mg | Freq: Four times a day (QID) | INTRAVENOUS | Status: DC | PRN

## 2022-01-14 MED ORDER — SODIUM CHLORIDE 0.9 % IV SOLN: INTRAVENOUS | Status: DC

## 2022-01-14 MED ORDER — DEXAMETHASONE SODIUM PHOSPHATE 10 MG/ML IJ SOLN
8.0000 mg | Freq: Once | INTRAMUSCULAR | Status: AC
  Administered 2022-01-14: 8 mg via INTRAVENOUS

## 2022-01-14 MED ORDER — POLYETHYLENE GLYCOL 3350 17 G PO PACK: 17.0000 g | PACK | Freq: Every day | ORAL | Status: DC | PRN

## 2022-01-14 MED ORDER — TRANEXAMIC ACID-NACL 1000-0.7 MG/100ML-% IV SOLN
1000.0000 mg | Freq: Once | INTRAVENOUS | Status: AC
  Administered 2022-01-14: 1000 mg via INTRAVENOUS
  Filled 2022-01-14: qty 100

## 2022-01-14 MED ORDER — DEXAMETHASONE SODIUM PHOSPHATE 10 MG/ML IJ SOLN
10.0000 mg | Freq: Once | INTRAMUSCULAR | Status: AC
  Administered 2022-01-15: 10 mg via INTRAVENOUS
  Filled 2022-01-14: qty 1

## 2022-01-14 MED ORDER — PHENYLEPHRINE HCL (PRESSORS) 10 MG/ML IV SOLN
INTRAVENOUS | Status: AC
  Filled 2022-01-14: qty 1

## 2022-01-14 MED ORDER — ORAL CARE MOUTH RINSE
15.0000 mL | Freq: Once | OROMUCOSAL | Status: AC
Start: 2022-01-14 — End: 2022-01-14

## 2022-01-14 MED ORDER — TRANEXAMIC ACID-NACL 1000-0.7 MG/100ML-% IV SOLN
1000.0000 mg | INTRAVENOUS | Status: AC
  Administered 2022-01-14: 1000 mg via INTRAVENOUS
  Filled 2022-01-14: qty 100

## 2022-01-14 MED ORDER — METOCLOPRAMIDE HCL 5 MG/ML IJ SOLN: 5.0000 mg | Freq: Three times a day (TID) | INTRAMUSCULAR | Status: DC | PRN

## 2022-01-14 MED ORDER — CEFAZOLIN SODIUM-DEXTROSE 2-4 GM/100ML-% IV SOLN
2.0000 g | INTRAVENOUS | Status: AC
  Administered 2022-01-14: 2 g via INTRAVENOUS
  Filled 2022-01-14: qty 100

## 2022-01-14 MED ORDER — 0.9 % SODIUM CHLORIDE (POUR BTL) OPTIME
TOPICAL | Status: DC | PRN
  Administered 2022-01-14: 1000 mL

## 2022-01-14 MED ORDER — ONDANSETRON HCL 4 MG/2ML IJ SOLN
INTRAMUSCULAR | Status: DC | PRN
  Administered 2022-01-14: 4 mg via INTRAVENOUS

## 2022-01-14 MED ORDER — HYDROMORPHONE HCL 1 MG/ML IJ SOLN: 0.2500 mg | INTRAMUSCULAR | Status: DC | PRN

## 2022-01-14 MED ORDER — BISACODYL 10 MG RE SUPP: 10.0000 mg | Freq: Every day | RECTAL | Status: DC | PRN

## 2022-01-14 MED ORDER — ACETAMINOPHEN 10 MG/ML IV SOLN: 1000.0000 mg | Freq: Once | INTRAVENOUS | Status: DC | PRN

## 2022-01-14 MED ORDER — PROPOFOL 1000 MG/100ML IV EMUL
INTRAVENOUS | Status: AC
  Filled 2022-01-14: qty 100

## 2022-01-14 MED ORDER — ONDANSETRON HCL 4 MG/2ML IJ SOLN
4.0000 mg | Freq: Four times a day (QID) | INTRAMUSCULAR | Status: DC | PRN
  Administered 2022-01-14: 4 mg via INTRAVENOUS
  Filled 2022-01-14: qty 2

## 2022-01-14 MED ORDER — FERROUS SULFATE 325 (65 FE) MG PO TABS
325.0000 mg | ORAL_TABLET | Freq: Three times a day (TID) | ORAL | Status: DC
  Administered 2022-01-14 – 2022-01-15 (×2): 325 mg via ORAL
  Filled 2022-01-14 (×2): qty 1

## 2022-01-14 MED ORDER — MIDAZOLAM HCL 2 MG/2ML IJ SOLN
INTRAMUSCULAR | Status: DC | PRN
  Administered 2022-01-14: 2 mg via INTRAVENOUS

## 2022-01-14 MED ORDER — MENTHOL 3 MG MT LOZG: 1.0000 | LOZENGE | OROMUCOSAL | Status: DC | PRN

## 2022-01-14 MED ORDER — DEXAMETHASONE SODIUM PHOSPHATE 10 MG/ML IJ SOLN
INTRAMUSCULAR | Status: AC
  Filled 2022-01-14: qty 1

## 2022-01-14 MED ORDER — ACETAMINOPHEN 325 MG PO TABS: 325.0000 mg | ORAL_TABLET | Freq: Four times a day (QID) | ORAL | Status: DC | PRN

## 2022-01-14 MED ORDER — CHLORHEXIDINE GLUCONATE 0.12 % MT SOLN
15.0000 mL | Freq: Once | OROMUCOSAL | Status: AC
  Administered 2022-01-14: 15 mL via OROMUCOSAL

## 2022-01-14 MED ORDER — ONDANSETRON HCL 4 MG/2ML IJ SOLN
INTRAMUSCULAR | Status: AC
  Filled 2022-01-14: qty 2

## 2022-01-14 MED ORDER — HYDROCODONE-ACETAMINOPHEN 5-325 MG PO TABS: 1.0000 | ORAL_TABLET | ORAL | Status: DC | PRN

## 2022-01-14 MED ORDER — PROPOFOL 10 MG/ML IV BOLUS
INTRAVENOUS | Status: DC | PRN
  Administered 2022-01-14: 20 mg via INTRAVENOUS

## 2022-01-14 MED ORDER — HYDROCODONE-ACETAMINOPHEN 7.5-325 MG PO TABS
1.0000 | ORAL_TABLET | ORAL | Status: DC | PRN
  Administered 2022-01-14: 2 via ORAL
  Administered 2022-01-14: 1 via ORAL
  Filled 2022-01-14: qty 2
  Filled 2022-01-14: qty 1

## 2022-01-14 MED ORDER — MORPHINE SULFATE (PF) 2 MG/ML IV SOLN
0.5000 mg | INTRAVENOUS | Status: DC | PRN
  Administered 2022-01-14: 1 mg via INTRAVENOUS
  Filled 2022-01-14: qty 1

## 2022-01-14 MED ORDER — LACTATED RINGERS IV SOLN: INTRAVENOUS | Status: DC

## 2022-01-14 MED ORDER — PHENYLEPHRINE HCL-NACL 20-0.9 MG/250ML-% IV SOLN
INTRAVENOUS | Status: DC | PRN
  Administered 2022-01-14: 40 ug/min via INTRAVENOUS

## 2022-01-14 MED ORDER — POVIDONE-IODINE 10 % EX SWAB
2.0000 "application " | Freq: Once | CUTANEOUS | Status: AC
  Administered 2022-01-14: 2 via TOPICAL

## 2022-01-14 MED ORDER — PHENYLEPHRINE 80 MCG/ML (10ML) SYRINGE FOR IV PUSH (FOR BLOOD PRESSURE SUPPORT)
PREFILLED_SYRINGE | INTRAVENOUS | Status: AC
  Filled 2022-01-14: qty 10

## 2022-01-14 MED ORDER — METOPROLOL TARTRATE 12.5 MG HALF TABLET
12.5000 mg | ORAL_TABLET | Freq: Three times a day (TID) | ORAL | Status: DC
  Administered 2022-01-14 – 2022-01-15 (×3): 12.5 mg via ORAL
  Filled 2022-01-14 (×3): qty 1

## 2022-01-14 MED ORDER — CEFAZOLIN SODIUM-DEXTROSE 2-4 GM/100ML-% IV SOLN
2.0000 g | Freq: Four times a day (QID) | INTRAVENOUS | Status: AC
  Administered 2022-01-14 – 2022-01-15 (×2): 2 g via INTRAVENOUS
  Filled 2022-01-14 (×2): qty 100

## 2022-01-14 MED ORDER — ASPIRIN 81 MG PO CHEW
81.0000 mg | CHEWABLE_TABLET | Freq: Two times a day (BID) | ORAL | Status: DC
  Administered 2022-01-14 – 2022-01-15 (×2): 81 mg via ORAL
  Filled 2022-01-14 (×2): qty 1

## 2022-01-14 MED ORDER — ONDANSETRON HCL 4 MG PO TABS: 4.0000 mg | ORAL_TABLET | Freq: Four times a day (QID) | ORAL | Status: DC | PRN

## 2022-01-14 MED ORDER — METHOCARBAMOL 500 MG PO TABS
500.0000 mg | ORAL_TABLET | Freq: Four times a day (QID) | ORAL | Status: DC | PRN
  Administered 2022-01-14 – 2022-01-15 (×2): 500 mg via ORAL
  Filled 2022-01-14 (×2): qty 1

## 2022-01-14 MED ORDER — PHENOL 1.4 % MT LIQD: 1.0000 | OROMUCOSAL | Status: DC | PRN

## 2022-01-14 MED ORDER — MIDAZOLAM HCL 2 MG/2ML IJ SOLN
INTRAMUSCULAR | Status: AC
  Filled 2022-01-14: qty 2

## 2022-01-14 MED ORDER — DIPHENHYDRAMINE HCL 12.5 MG/5ML PO ELIX: 12.5000 mg | ORAL_SOLUTION | ORAL | Status: DC | PRN

## 2022-01-14 MED ORDER — METOCLOPRAMIDE HCL 5 MG PO TABS: 5.0000 mg | ORAL_TABLET | Freq: Three times a day (TID) | ORAL | Status: DC | PRN

## 2022-01-14 MED ORDER — ONDANSETRON HCL 4 MG/2ML IJ SOLN: 4.0000 mg | Freq: Once | INTRAMUSCULAR | Status: DC | PRN

## 2022-01-14 MED ORDER — DOCUSATE SODIUM 100 MG PO CAPS
100.0000 mg | ORAL_CAPSULE | Freq: Two times a day (BID) | ORAL | Status: DC
  Administered 2022-01-14 – 2022-01-15 (×2): 100 mg via ORAL
  Filled 2022-01-14 (×2): qty 1

## 2022-01-14 MED ORDER — PROPOFOL 500 MG/50ML IV EMUL
INTRAVENOUS | Status: DC | PRN
  Administered 2022-01-14: 100 ug/kg/min via INTRAVENOUS

## 2022-01-14 MED ORDER — PROPOFOL 10 MG/ML IV BOLUS
INTRAVENOUS | Status: AC
  Filled 2022-01-14: qty 20

## 2022-01-14 MED ORDER — CELECOXIB 200 MG PO CAPS
200.0000 mg | ORAL_CAPSULE | Freq: Two times a day (BID) | ORAL | Status: DC
  Administered 2022-01-14 – 2022-01-15 (×3): 200 mg via ORAL
  Filled 2022-01-14 (×3): qty 1

## 2022-01-14 SURGICAL SUPPLY — 43 items
BAG COUNTER SPONGE SURGICOUNT (BAG) IMPLANT
BAG DECANTER FOR FLEXI CONT (MISCELLANEOUS) IMPLANT
BAG ZIPLOCK 12X15 (MISCELLANEOUS) IMPLANT
BLADE SAG 18X100X1.27 (BLADE) ×2 IMPLANT
COVER PERINEAL POST (MISCELLANEOUS) ×2 IMPLANT
COVER SURGICAL LIGHT HANDLE (MISCELLANEOUS) ×2 IMPLANT
CUP ACETBLR 54 OD PINNACLE (Hips) ×1 IMPLANT
DERMABOND ADVANCED (GAUZE/BANDAGES/DRESSINGS) ×1
DERMABOND ADVANCED .7 DNX12 (GAUZE/BANDAGES/DRESSINGS) ×1 IMPLANT
DRAPE FOOT SWITCH (DRAPES) ×2 IMPLANT
DRAPE STERI IOBAN 125X83 (DRAPES) ×2 IMPLANT
DRAPE U-SHAPE 47X51 STRL (DRAPES) ×4 IMPLANT
DRESSING AQUACEL AG SP 3.5X10 (GAUZE/BANDAGES/DRESSINGS) ×1 IMPLANT
DRESSING AQUACEL AG SP 3.5X6 (GAUZE/BANDAGES/DRESSINGS) IMPLANT
DRSG AQUACEL AG SP 3.5X10 (GAUZE/BANDAGES/DRESSINGS) ×2
DRSG AQUACEL AG SP 3.5X6 (GAUZE/BANDAGES/DRESSINGS) ×2
DURAPREP 26ML APPLICATOR (WOUND CARE) ×2 IMPLANT
ELECT REM PT RETURN 15FT ADLT (MISCELLANEOUS) ×2 IMPLANT
ELIMINATOR HOLE APEX DEPUY (Hips) ×1 IMPLANT
GLOVE BIO SURGEON STRL SZ 6 (GLOVE) ×2 IMPLANT
GLOVE BIOGEL PI IND STRL 6.5 (GLOVE) ×1 IMPLANT
GLOVE BIOGEL PI IND STRL 7.5 (GLOVE) ×1 IMPLANT
GLOVE BIOGEL PI INDICATOR 6.5 (GLOVE) ×1
GLOVE BIOGEL PI INDICATOR 7.5 (GLOVE) ×1
GLOVE ORTHO TXT STRL SZ7.5 (GLOVE) ×4 IMPLANT
GOWN STRL REUS W/ TWL LRG LVL3 (GOWN DISPOSABLE) ×3 IMPLANT
GOWN STRL REUS W/TWL LRG LVL3 (GOWN DISPOSABLE) ×6
HEAD CERAMIC DELTA 36 PLUS 1.5 (Hips) ×1 IMPLANT
HOLDER FOLEY CATH W/STRAP (MISCELLANEOUS) ×2 IMPLANT
KIT TURNOVER KIT A (KITS) IMPLANT
LINER NEUTRAL 54X36MM PLUS 4 (Hips) ×1 IMPLANT
PACK ANTERIOR HIP CUSTOM (KITS) ×2 IMPLANT
SCREW 6.5MMX35MM (Screw) ×1 IMPLANT
STEM FEMORAL SZ5 HIGH ACTIS (Stem) ×1 IMPLANT
SUT MNCRL AB 4-0 PS2 18 (SUTURE) ×2 IMPLANT
SUT STRATAFIX 0 PDS 27 VIOLET (SUTURE) ×2
SUT VIC AB 1 CT1 36 (SUTURE) ×6 IMPLANT
SUT VIC AB 2-0 CT1 27 (SUTURE) ×4
SUT VIC AB 2-0 CT1 TAPERPNT 27 (SUTURE) ×2 IMPLANT
SUTURE STRATFX 0 PDS 27 VIOLET (SUTURE) ×1 IMPLANT
TRAY FOLEY MTR SLVR 16FR STAT (SET/KITS/TRAYS/PACK) IMPLANT
TUBE SUCTION HIGH CAP CLEAR NV (SUCTIONS) ×2 IMPLANT
WATER STERILE IRR 1000ML POUR (IV SOLUTION) ×2 IMPLANT

## 2022-01-14 NOTE — Interval H&P Note (Signed)
History and Physical Interval Note:  01/14/2022 10:07 AM  Derek Hayden  has presented today for surgery, with the diagnosis of Right hip osteoarthritis.  The various methods of treatment have been discussed with the patient and family. After consideration of risks, benefits and other options for treatment, the patient has consented to  Procedure(s): TOTAL HIP ARTHROPLASTY ANTERIOR APPROACH (Right) as a surgical intervention.  The patient's history has been reviewed, patient examined, no change in status, stable for surgery.  I have reviewed the patient's chart and labs.  Questions were answered to the patient's satisfaction.     Mauri Pole

## 2022-01-14 NOTE — Anesthesia Procedure Notes (Signed)
Procedure Name: MAC Date/Time: 01/14/2022 11:22 AM Performed by: Niel Hummer, CRNA Pre-anesthesia Checklist: Patient identified, Emergency Drugs available, Suction available and Patient being monitored Oxygen Delivery Method: Simple face mask

## 2022-01-14 NOTE — Anesthesia Procedure Notes (Signed)
Spinal  Patient location during procedure: OR Start time: 01/14/2022 11:23 AM End time: 01/14/2022 11:30 AM Reason for block: surgical anesthesia Staffing Performed: anesthesiologist  Anesthesiologist: Myrtie Soman, MD Preanesthetic Checklist Completed: patient identified, IV checked, site marked, risks and benefits discussed, surgical consent, monitors and equipment checked, pre-op evaluation and timeout performed Spinal Block Patient position: sitting Prep: Betadine Patient monitoring: heart rate, continuous pulse ox and blood pressure Approach: midline Location: L3-4 Injection technique: single-shot Needle Needle type: Sprotte  Needle gauge: 24 G Needle length: 9 cm Assessment Sensory level: T6 Events: CSF return and second provider Additional Notes

## 2022-01-14 NOTE — Evaluation (Signed)
Physical Therapy Evaluation Patient Details Name: Derek Hayden MRN: [id number] DOB: May 29, 1951 Today's Date: 01/14/2022  History of Present Illness  Pt is a 71yo male presenting s/p R-THA, AA on 01/14/22. PMH: OA, CAD, HLD, HTN, skin cancer, CABG, L foot surgery 2016.`  Clinical Impression  HALSEY HAMMEN is a 71 y.o. male POD 0 s/p R-THA, AA. Patient reports independence with mobility at baseline. Patient is now limited by functional impairments (see PT problem list below) and requires min guard for transfers and gait with RW. Patient was able to ambulate 40 feet with RW and min guard assist. Patient instructed in exercise to facilitate ROM and circulation to manage edema. Patient will benefit from continued skilled PT interventions to address impairments and progress towards PLOF. Acute PT will follow to progress mobility and stair training in preparation for safe discharge home.       Recommendations for follow up therapy are one component of a multi-disciplinary discharge planning process, led by the attending physician.  Recommendations may be updated based on patient status, additional functional criteria and insurance authorization.  Follow Up Recommendations Follow physician's recommendations for discharge plan and follow up therapies    Assistance Recommended at Discharge Intermittent Supervision/Assistance  Patient can return home with the following  A little help with walking and/or transfers;A little help with bathing/dressing/bathroom;Assistance with cooking/housework;Assist for transportation;Help with stairs or ramp for entrance    Equipment Recommendations Rolling walker (2 wheels)  Recommendations for Other Services       Functional Status Assessment Patient has had a recent decline in their functional status and demonstrates the ability to make significant improvements in function in a reasonable and predictable amount of time.     Precautions / Restrictions  Precautions Precautions: Fall Restrictions Weight Bearing Restrictions: No Other Position/Activity Restrictions: WBAT      Mobility  Bed Mobility Overal bed mobility: Needs Assistance Bed Mobility: Supine to Sit     Supine to sit: Supervision     General bed mobility comments: For safety only    Transfers Overall transfer level: Needs assistance Equipment used: Rolling walker (2 wheels) Transfers: Sit to/from Stand Sit to Stand: Min guard, From elevated surface           General transfer comment: For safety only from elevated surface to match pt's bed at home, no physical assist required, VCs for hand placement to power up through [place].    Ambulation/Gait Ambulation/Gait assistance: Min guard, +2 safety/equipment Gait Distance (Feet): 40 Feet Assistive device: Rolling walker (2 wheels) Gait Pattern/deviations: Step-to pattern Gait velocity: decreased     General Gait Details: Pt ambulated with RW and min guard assist, no physical assist required or overt LOB noted, +2 recliner follow for safety only.  Stairs            Wheelchair Mobility    Modified Rankin (Stroke Patients Only)       Balance Overall balance assessment: Needs assistance Sitting-balance support: Feet supported, No upper extremity supported Sitting balance-Leahy Scale: Good     Standing balance support: Reliant on assistive device for balance, During functional activity, Bilateral upper extremity supported Standing balance-Leahy Scale: Poor                               Pertinent Vitals/Pain Pain Assessment Pain Assessment: No/denies pain    Home Living Family/patient expects to be discharged to:: Private residence Living Arrangements: Alone Available Help at Discharge:  Family;Friend(s);Available 24 hours/day (Brother Lorel Monaco [id number]) Type of Home: Apartment Home Access: Stairs to enter Entrance Stairs-Rails: Right Entrance Stairs-Number of Steps: 16    Home Layout: One level Home Equipment: Grab bars - tub/shower      Prior Function Prior Level of Function : Independent/Modified Independent             Mobility Comments: ind ADLs Comments: ind     Hand Dominance   Dominant Hand: Right    Extremity/Trunk Assessment   Upper Extremity Assessment Upper Extremity Assessment: Overall WFL for tasks assessed    Lower Extremity Assessment Lower Extremity Assessment: RLE deficits/detail;LLE deficits/detail RLE Deficits / Details: MMT ank DF/PF 5/5 RLE Sensation: WNL LLE Deficits / Details: MMT ank DF/PF 5/5 LLE Sensation: WNL    Cervical / Trunk Assessment Cervical / Trunk Assessment: Normal  Communication   Communication: No difficulties  Cognition Arousal/Alertness: Awake/alert Behavior During Therapy: WFL for tasks assessed/performed Overall Cognitive Status: Within Functional Limits for tasks assessed                                          General Comments General comments (skin integrity, edema, etc.): Brother Nicole Kindred present for session    Exercises Total Joint Exercises Ankle Circles/Pumps: AROM, Both, 10 reps Other Exercises Other Exercises: incentive spirometry x3, pt required no cuing   Assessment/Plan    PT Assessment Patient needs continued PT services  PT Problem List Decreased strength;Decreased activity tolerance;Decreased range of motion;Decreased balance;Decreased mobility;Decreased coordination;Pain       PT Treatment Interventions DME instruction;Gait training;Stair training;Functional mobility training;Therapeutic activities;Therapeutic exercise;Balance training;Neuromuscular re-education;Patient/family education    PT Goals (Current goals can be found in the Care Plan section)  Acute Rehab PT Goals Patient Stated Goal: To walk without pain PT Goal Formulation: With patient Time For Goal Achievement: 01/21/22 Potential to Achieve Goals: Good    Frequency 7X/week      Co-evaluation               AM-PAC PT "6 Clicks" Mobility  Outcome Measure Help needed turning from your back to your side while in a flat bed without using bedrails?: None Help needed moving from lying on your back to sitting on the side of a flat bed without using bedrails?: A Little Help needed moving to and from a bed to a chair (including a wheelchair)?: A Little Help needed standing up from a chair using your arms (e.g., wheelchair or bedside chair)?: A Little Help needed to walk in hospital room?: A Little Help needed climbing 3-5 steps with a railing? : A Lot 6 Click Score: 18    End of Session Equipment Utilized During Treatment: Gait belt Activity Tolerance: Patient tolerated treatment well;No increased pain Patient left: in chair;with call bell/phone within reach;with chair alarm set;with family/visitor present Nurse Communication: Mobility status PT Visit Diagnosis: Difficulty in walking, not elsewhere classified (R26.2);Pain Pain - Right/Left: Right Pain - part of body: Hip    Time: 6269-4854 PT Time Calculation (min) (ACUTE ONLY): 19 min   Charges:   PT Evaluation $PT Eval Low Complexity: 1 Low          Coolidge Breeze, PT, DPT [facility] Department Office: [phone] Pager: [phone]  Coolidge Breeze 01/14/2022, 5:45 PM

## 2022-01-14 NOTE — Transfer of Care (Signed)
Immediate Anesthesia Transfer of Care Note  Patient: Derek Hayden  Procedure(s) Performed: TOTAL HIP ARTHROPLASTY ANTERIOR APPROACH (Right: Hip)  Patient Location: PACU  Anesthesia Type:Spinal  Level of Consciousness: drowsy  Airway & Oxygen Therapy: Patient Spontanous Breathing and Patient connected to face mask oxygen  Post-op Assessment: Report given to RN and Post -op Vital signs reviewed and stable  Post vital signs: Reviewed and stable  Last Vitals:  Vitals Value Taken Time  BP 94/44   Temp    Pulse 58 01/14/22 1316  Resp 18 01/14/22 1316  SpO2 99 % 01/14/22 1316  Vitals shown include unvalidated device data.  Last Pain:  Vitals:   01/14/22 0902  TempSrc:   PainSc: 4       Patients Stated Pain Goal: 4 (28/63/81 7711)  Complications: No notable events documented.

## 2022-01-14 NOTE — Anesthesia Preprocedure Evaluation (Signed)
Anesthesia Evaluation  Patient identified by MRN, date of birth, ID band Patient awake    Reviewed: Allergy & Precautions, NPO status , Patient's Chart, lab work & pertinent test results  Airway Mallampati: II  TM Distance: >3 FB Neck ROM: Full    Dental no notable dental hx.    Pulmonary neg pulmonary ROS,    Pulmonary exam normal breath sounds clear to auscultation       Cardiovascular hypertension, + CAD and + CABG  Normal cardiovascular exam Rhythm:Regular Rate:Normal     Neuro/Psych negative neurological ROS  negative psych ROS   GI/Hepatic negative GI ROS, Neg liver ROS,   Endo/Other  negative endocrine ROS  Renal/GU negative Renal ROS  negative genitourinary   Musculoskeletal negative musculoskeletal ROS (+)   Abdominal   Peds negative pediatric ROS (+)  Hematology negative hematology ROS (+)   Anesthesia Other Findings   Reproductive/Obstetrics negative OB ROS                             Anesthesia Physical Anesthesia Plan  ASA: 3  Anesthesia Plan: Spinal   Post-op Pain Management: Regional block*   Induction: Intravenous  PONV Risk Score and Plan: 1 and Propofol infusion and Treatment may vary due to age or medical condition  Airway Management Planned: Simple Face Mask  Additional Equipment:   Intra-op Plan:   Post-operative Plan:   Informed Consent: I have reviewed the patients History and Physical, chart, labs and discussed the procedure including the risks, benefits and alternatives for the proposed anesthesia with the patient or authorized representative who has indicated his/her understanding and acceptance.     Dental advisory given  Plan Discussed with: CRNA and Surgeon  Anesthesia Plan Comments:         Anesthesia Quick Evaluation

## 2022-01-14 NOTE — Anesthesia Postprocedure Evaluation (Signed)
Anesthesia Post Note  Patient: Derek Hayden  Procedure(s) Performed: TOTAL HIP ARTHROPLASTY ANTERIOR APPROACH (Right: Hip)     Patient location during evaluation: PACU Anesthesia Type: Spinal Level of consciousness: oriented and awake and alert Pain management: pain level controlled Vital Signs Assessment: post-procedure vital signs reviewed and stable Respiratory status: spontaneous breathing, respiratory function stable and patient connected to nasal cannula oxygen Cardiovascular status: blood pressure returned to baseline and stable Postop Assessment: no headache, no backache and no apparent nausea or vomiting Anesthetic complications: no   No notable events documented.  Last Vitals:  Vitals:   01/14/22 1345 01/14/22 1400  BP: 111/64 116/65  Pulse: 77 67  Resp: 17 17  Temp:    SpO2: 100% 100%    Last Pain:  Vitals:   01/14/22 1400  TempSrc:   PainSc: 0-No pain                 Sherida Dobkins S

## 2022-01-14 NOTE — Op Note (Addendum)
NAME:  Derek Hayden NO.: [id number]      MEDICAL RECORD NO.: [id number]      FACILITY:  [facility]      PHYSICIAN:  Mauri Pole  DATE OF BIRTH:  1951/01/23     DATE OF PROCEDURE:  01/14/2022                                 OPERATIVE REPORT         PREOPERATIVE DIAGNOSIS: Right  hip osteoarthritis.      POSTOPERATIVE DIAGNOSIS:  Right hip osteoarthritis.      PROCEDURE:  Right total hip replacement through an anterior approach   utilizing DePuy THR system, component size 56 mm pinnacle cup, a size 36+4 neutral   Altrex liner, a size 5 Hi Actis stem with a 36+1.5 delta ceramic   ball.      SURGEON:  Pietro Cassis. Alvan Dame, M.D.      ASSISTANT:  Costella Hatcher, PA-C     ANESTHESIA:  Spinal.      SPECIMENS:  None.      COMPLICATIONS:  None.      BLOOD LOSS:  225 cc     DRAINS:  None.      INDICATION OF THE PROCEDURE:  Derek Hayden is a 71 y.o. male who had   presented to office for evaluation of right hip pain.  Radiographs revealed   progressive degenerative changes with bone-on-bone   articulation of the  hip joint, including subchondral cystic changes and osteophytes.  The patient had painful limited range of   motion significantly affecting their overall quality of life and function.  The patient was failing to    respond to conservative measures including medications and/or injections and activity modification and at this point was ready   to proceed with more definitive measures.  Consent was obtained for   benefit of pain relief.  Specific risks of infection, DVT, component   failure, dislocation, neurovascular injury, and need for revision surgery were reviewed in the office.     PROCEDURE IN DETAIL:  The patient was brought to operative theater.   Once adequate anesthesia, preoperative antibiotics, 2 gm of Ancef, 1 gm of Tranexamic Acid, and 10 mg of Decadron were administered, the patient was positioned supine on the [employer] table.  Once the patient was safely positioned with adequate padding of boney prominences we predraped out the hip, and used fluoroscopy to confirm orientation of the pelvis.      The right hip was then prepped and draped from proximal iliac crest to   mid thigh with a shower curtain technique.      Time-out was performed identifying the patient, planned procedure, and the appropriate extremity.     An incision was then made 2 cm lateral to the   anterior superior iliac spine extending over the orientation of the   tensor fascia lata muscle and sharp dissection was carried down to the   fascia of the muscle.      The fascia was then incised.  The muscle belly was identified and swept   laterally and retractor placed along the superior neck.  Following   cauterization of the circumflex vessels and removing some pericapsular   fat, a second cobra retractor was placed on the inferior  neck.  A T-capsulotomy was made along the line of the   superior neck to the trochanteric fossa, then extended proximally and   distally.  Tag sutures were placed and the retractors were then placed   intracapsular.  We then identified the trochanteric fossa and   orientation of my neck cut and then made a neck osteotomy with the femur on traction.  The femoral   head was removed without difficulty or complication.  Traction was let   off and retractors were placed posterior and anterior around the   acetabulum.      The labrum and foveal tissue were debrided.  I began reaming with a 51 mm   reamer and reamed up to 55 mm reamer with good bony bed preparation and a 56 mm  cup was chosen.  The final 56 mm Pinnacle cup was then impacted under fluoroscopy to confirm the depth of penetration and orientation with respect to   Abduction and forward flexion.  A screw was placed into the ilium followed by the hole eliminator.  The final   36+4 neutral Altrex liner was impacted with good visualized rim fit.  The cup  was positioned anatomically within the acetabular portion of the pelvis.      At this point, the femur was rolled to 100 degrees.  Further capsule was   released off the inferior aspect of the femoral neck.  I then   released the superior capsule proximally.  With the leg in a neutral position the hook was placed laterally   along the femur under the vastus lateralis origin and elevated manually and then held in position using the hook attachment on the bed.  The leg was then extended and adducted with the leg rolled to 100   degrees of external rotation.  Retractors were placed along the medial calcar and posteriorly over the greater trochanter.  Once the proximal femur was fully   exposed, I used a box osteotome to set orientation.  I then began   broaching with the starting chili pepper broach and passed this by hand and then broached up to 5.  With the 5 broach in place I chose a high offset neck and did several trial reductions.  The offset was appropriate, leg lengths   appeared to be equal best matched with the +1.5 head ball trial confirmed radiographically.   Given these findings, I went ahead and dislocated the hip, repositioned all   retractors and positioned the right hip in the extended and abducted position.  The final 5 Hi Actis stem was   chosen and it was impacted down to the level of neck cut.  Based on this   and the trial reductions, a final 36+1.5 delta ceramic ball was chosen and   impacted onto a clean and dry trunnion, and the hip was reduced.  The   hip had been irrigated throughout the case again at this point.  I did   reapproximate the superior capsular leaflet to the anterior leaflet   using #1 Vicryl.  The fascia of the   tensor fascia lata muscle was then reapproximated using #1 Vicryl and #0 Stratafix sutures.  The   remaining wound was closed with 2-0 Vicryl and running 4-0 Monocryl.   The hip was cleaned, dried, and dressed sterilely using Dermabond and    Aquacel dressing.  The patient was then brought   to recovery room in stable condition tolerating the procedure well.    Caryl Pina  Lu Duffel, PA-C was present for the entirety of the case involved from   preoperative positioning, perioperative retractor management, general   facilitation of the case, as well as primary wound closure as assistant.            Pietro Cassis Alvan Dame, M.D.        01/14/2022 12:54 PM

## 2022-01-14 NOTE — Discharge Instructions (Signed)

## 2022-01-15 ENCOUNTER — Other Ambulatory Visit (HOSPITAL_COMMUNITY): Payer: Self-pay

## 2022-01-15 ENCOUNTER — Encounter (HOSPITAL_COMMUNITY): Payer: Self-pay | Admitting: Orthopedic Surgery

## 2022-01-15 DIAGNOSIS — M1611 Unilateral primary osteoarthritis, right hip: Secondary | ICD-10-CM | POA: Diagnosis not present

## 2022-01-15 LAB — BASIC METABOLIC PANEL
Anion gap: 5 (ref 5–15)
BUN: 15 mg/dL (ref 8–23)
CO2: 26 mmol/L (ref 22–32)
Calcium: 8.5 mg/dL — ABNORMAL LOW (ref 8.9–10.3)
Chloride: 106 mmol/L (ref 98–111)
Creatinine, Ser: 0.91 mg/dL (ref 0.61–1.24)
GFR, Estimated: >60 mL/min (ref >60)
Glucose, Bld: 133 mg/dL — ABNORMAL HIGH (ref 70–99)
Potassium: 4.5 mmol/L (ref 3.5–5.1)
Sodium: 137 mmol/L (ref 135–145)

## 2022-01-15 LAB — CBC
HCT: 34 % — ABNORMAL LOW (ref 39.0–52.0)
Hemoglobin: 11.6 g/dL — ABNORMAL LOW (ref 13.0–17.0)
MCH: 32.2 pg (ref 26.0–34.0)
MCHC: 34.1 g/dL (ref 30.0–36.0)
MCV: 94.4 fL (ref 80.0–100.0)
Platelets: 144 10*3/uL — ABNORMAL LOW (ref 150–400)
RBC: 3.6 MIL/uL — ABNORMAL LOW (ref 4.22–5.81)
RDW: 13.7 % (ref 11.5–15.5)
WBC: 9.8 10*3/uL (ref 4.0–10.5)
nRBC: 0 % (ref 0.0–0.2)

## 2022-01-15 MED ORDER — DOCUSATE SODIUM 100 MG PO CAPS
100.0000 mg | ORAL_CAPSULE | Freq: Two times a day (BID) | ORAL | 0 refills | Status: DC
  Filled 2022-01-15: qty 10, 5d supply, fill #0

## 2022-01-15 MED ORDER — POLYETHYLENE GLYCOL 3350 17 G PO PACK
17.0000 g | PACK | Freq: Every day | ORAL | 0 refills | Status: DC | PRN
  Filled 2022-01-15: qty 14, 14d supply, fill #0

## 2022-01-15 MED ORDER — HYDROCODONE-ACETAMINOPHEN 5-325 MG PO TABS
1.0000 | ORAL_TABLET | ORAL | 0 refills | Status: DC | PRN
Start: 2022-01-15 — End: 2023-09-07
  Filled 2022-01-15: qty 30, 5d supply, fill #0

## 2022-01-15 MED ORDER — CELECOXIB 200 MG PO CAPS
200.0000 mg | ORAL_CAPSULE | Freq: Two times a day (BID) | ORAL | 0 refills | Status: AC
  Filled 2022-01-15: qty 60, 30d supply, fill #0

## 2022-01-15 MED ORDER — ASPIRIN 81 MG PO CHEW
81.0000 mg | CHEWABLE_TABLET | Freq: Two times a day (BID) | ORAL | 0 refills | Status: AC
  Filled 2022-01-15: qty 56, 28d supply, fill #0

## 2022-01-15 MED ORDER — METHOCARBAMOL 500 MG PO TABS
500.0000 mg | ORAL_TABLET | Freq: Four times a day (QID) | ORAL | 0 refills | Status: DC | PRN
  Filled 2022-01-15: qty 40, 10d supply, fill #0

## 2022-01-15 NOTE — Plan of Care (Signed)
  Problem: Health Behavior/Discharge Planning: Goal: Ability to manage health-related needs will improve 01/15/2022 1249 by Carmelia Roller, RN Outcome: Completed/Met 01/15/2022 1051 by Carmelia Roller, RN Outcome: Progressing   Problem: Clinical Measurements: Goal: Ability to maintain clinical measurements within normal limits will improve Outcome: Completed/Met Goal: Will remain free from infection 01/15/2022 1249 by Carmelia Roller, RN Outcome: Completed/Met 01/15/2022 1051 by Carmelia Roller, RN Outcome: Progressing Goal: Diagnostic test results will improve Outcome: Completed/Met Goal: Respiratory complications will improve Outcome: Completed/Met Goal: Cardiovascular complication will be avoided Outcome: Completed/Met   Problem: Activity: Goal: Risk for activity intolerance will decrease 01/15/2022 1249 by Carmelia Roller, RN Outcome: Completed/Met 01/15/2022 1051 by Carmelia Roller, RN Outcome: Progressing   Problem: Nutrition: Goal: Adequate nutrition will be maintained 01/15/2022 1249 by Carmelia Roller, RN Outcome: Completed/Met 01/15/2022 1051 by Carmelia Roller, RN Outcome: Progressing   Problem: Coping: Goal: Level of anxiety will decrease Outcome: Completed/Met   Problem: Elimination: Goal: Will not experience complications related to bowel motility 01/15/2022 1249 by Carmelia Roller, RN Outcome: Completed/Met 01/15/2022 1051 by Carmelia Roller, RN Outcome: Progressing Goal: Will not experience complications related to urinary retention 01/15/2022 1249 by Carmelia Roller, RN Outcome: Completed/Met 01/15/2022 1051 by Carmelia Roller, RN Outcome: Progressing   Problem: Pain Managment: Goal: General experience of comfort will improve Outcome: Completed/Met   Problem: Safety: Goal: Ability to remain free from injury will improve 01/15/2022 1249 by Carmelia Roller, RN Outcome: Completed/Met 01/15/2022 1051 by Carmelia Roller, RN Outcome:  Progressing   Problem: Skin Integrity: Goal: Risk for impaired skin integrity will decrease Outcome: Completed/Met   Problem: Education: Goal: Knowledge of the prescribed therapeutic regimen will improve 01/15/2022 1249 by Carmelia Roller, RN Outcome: Completed/Met 01/15/2022 1051 by Carmelia Roller, RN Outcome: Progressing Goal: Understanding of discharge needs will improve Outcome: Completed/Met Goal: Individualized Educational Video(s) Outcome: Completed/Met   Problem: Activity: Goal: Ability to avoid complications of mobility impairment will improve Outcome: Completed/Met Goal: Ability to tolerate increased activity will improve Outcome: Completed/Met   Problem: Clinical Measurements: Goal: Postoperative complications will be avoided or minimized Outcome: Completed/Met   Problem: Pain Management: Goal: Pain level will decrease with appropriate interventions 01/15/2022 1249 by Carmelia Roller, RN Outcome: Completed/Met 01/15/2022 1051 by Carmelia Roller, RN Outcome: Progressing   Problem: Skin Integrity: Goal: Will show signs of wound healing Outcome: Completed/Met

## 2022-01-15 NOTE — TOC Transition Note (Signed)
Transition of Care [facility]) - CM/SW Discharge Note   Patient Details  Name: Derek Hayden MRN: [id number] Date of Birth: 1950/12/23  Transition of Care [facility] CM/SW Contact:  Vassie Moselle, LCSW Phone Number: 01/15/2022, 10:00 AM   Clinical Narrative:    Met with pt and confirmed pt is to return home with support from brother. Pt plan for HEP. RW to be delivered to pt's room. No further TOC needs.    Final next level of care: Home/Self Care Barriers to Discharge: No Barriers Identified   Patient Goals and CMS Choice Patient states their goals for this hospitalization and ongoing recovery are:: Return home      Discharge Placement                       Discharge Plan and Services                DME Arranged: Walker rolling DME Agency: [facility]                  Social Determinants of Health (SDOH) Interventions     Readmission Risk Interventions     View : No data to display.

## 2022-01-15 NOTE — Progress Notes (Signed)
   Subjective: 1 Day Post-Op Procedure(s) (LRB): TOTAL HIP ARTHROPLASTY ANTERIOR APPROACH (Right) Patient reports pain as mild.   Patient seen in rounds with Dr. Alvan Dame. Patient is resting in bed on exam this morning. No acute events overnight. Ambulated 40 feet with PT yesterday.  We will start therapy today.   Objective: Vital signs in last 24 hours: Temp:  [97.5 F (36.4 C)-98.2 F (36.8 C)] 98 F (36.7 C) (05/24 0534) Pulse Rate:  [57-86] 63 (05/24 0534) Resp:  [15-18] 17 (05/24 0534) BP: (94-150)/(44-91) 132/86 (05/24 0534) SpO2:  [96 %-100 %] 98 % (05/24 0534) Weight:  [87.5 kg] 87.5 kg (05/23 0902)  Intake/Output from previous day:  Intake/Output Summary (Last 24 hours) at 01/15/2022 0746 Last data filed at 01/15/2022 0600 Gross per 24 hour  Intake 3764.98 ml  Output 1775 ml  Net 1989.98 ml     Intake/Output this shift: No intake/output data recorded.  Labs: Recent Labs    01/15/22 0317  HGB 11.6*   Recent Labs    01/15/22 0317  WBC 9.8  RBC 3.60*  HCT 34.0*  PLT 144*   Recent Labs    01/15/22 0317  NA 137  K 4.5  CL 106  CO2 26  BUN 15  CREATININE 0.91  GLUCOSE 133*  CALCIUM 8.5*   No results for input(s): LABPT, INR in the last 72 hours.  Exam: General - Patient is Alert and Oriented Extremity - Neurologically intact Sensation intact distally Intact pulses distally Dorsiflexion/Plantar flexion intact Dressing - dressing C/D/I Motor Function - intact, moving foot and toes well on exam.   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   Weight as of this encounter: 87.5 kg. Advance diet Up with therapy D/C IV fluids  DVT Prophylaxis - Aspirin Weight bearing as tolerated.  Hgb stable at 11.6 this AM.  Plan is to go Home after hospital stay. Plan for discharge today after meeting goals with therapy. Follow up in the office in 2 weeks.   Griffith Citron, PA-C Orthopedic Surgery [phone] 01/15/2022, 7:46 AM

## 2022-01-15 NOTE — Progress Notes (Signed)
Discharge instructions reviewed with patient with brother at bedside. All questions and concerns addressed. Patient declined pain relief for ride home. All home medications given to patient by pharmacy. All belongings returned. DME sent home with patient. IV removed, intact. No distress noted.

## 2022-01-15 NOTE — Progress Notes (Signed)
Physical Therapy Treatment Patient Details Name: Derek Hayden MRN: [id number] DOB: Dec 24, 1950 Today's Date: 01/15/2022   History of Present Illness Pt is a 71yo male presenting s/p R-THA, AA on 01/14/22. PMH: OA, CAD, HLD, HTN, skin cancer, CABG, L foot surgery 2016.`    PT Comments    POD # 1 pm session Asissted with amb in hallway and practiced stairs.  General stair comments: one right rail using B UE's for support and 25% VC's on proper tech.  Pt lives in an apartment second floor. Addressed all mobility questions, discussed appropriate activity, educated on use of ICE.  Pt ready for D/C to home.   Recommendations for follow up therapy are one component of a multi-disciplinary discharge planning process, led by the attending physician.  Recommendations may be updated based on patient status, additional functional criteria and insurance authorization.  Follow Up Recommendations  Follow physician's recommendations for discharge plan and follow up therapies     Assistance Recommended at Discharge Intermittent Supervision/Assistance  Patient can return home with the following A little help with walking and/or transfers;A little help with bathing/dressing/bathroom;Assistance with cooking/housework;Assist for transportation;Help with stairs or ramp for entrance   Equipment Recommendations  Rolling walker (2 wheels)    Recommendations for Other Services       Precautions / Restrictions Precautions Precautions: Fall Restrictions Weight Bearing Restrictions: No RLE Weight Bearing: Weight bearing as tolerated     Mobility  Bed Mobility               General bed mobility comments: OOB in recliner    Transfers Overall transfer level: Needs assistance Equipment used: Rolling walker (2 wheels) Transfers: Sit to/from Stand Sit to Stand: Supervision, Min guard           General transfer comment: one VC on proper hand placement and safety with turns.     Ambulation/Gait Ambulation/Gait assistance: Supervision Gait Distance (Feet): 75 Feet Assistive device: Rolling walker (2 wheels) Gait Pattern/deviations: Step-to pattern Gait velocity: decreased     General Gait Details: 25% VC's on proper sequencing and safety with turns.   Stairs Stairs: Yes Stairs assistance: Supervision, Min guard Stair Management: One rail Right, Step to pattern, Forwards Number of Stairs: 4 General stair comments: one right rail using B UE's for support and 25% VC's on proper tech.  Pt lives in an apartment second floor.   Wheelchair Mobility    Modified Rankin (Stroke Patients Only)       Balance                                            Cognition Arousal/Alertness: Awake/alert Behavior During Therapy: WFL for tasks assessed/performed Overall Cognitive Status: Within Functional Limits for tasks assessed                                 General Comments: AxO x3 very pleasant and motivated        Exercises  05 reps all standing TE's     General Comments        Pertinent Vitals/Pain Pain Assessment Pain Assessment: 0-10 Pain Score: 6  Pain Location: R hip Pain Descriptors / Indicators: Discomfort, Tender, Operative site guarding Pain Intervention(s): Monitored during session, Premedicated before session, Repositioned, Ice applied    Home Living  Prior Function            PT Goals (current goals can now be found in the care plan section) Progress towards PT goals: Progressing toward goals    Frequency    7X/week      PT Plan Current plan remains appropriate    Co-evaluation              AM-PAC PT "6 Clicks" Mobility   Outcome Measure  Help needed turning from your back to your side while in a flat bed without using bedrails?: A Little Help needed moving from lying on your back to sitting on the side of a flat bed without using bedrails?: A  Little Help needed moving to and from a bed to a chair (including a wheelchair)?: A Little Help needed standing up from a chair using your arms (e.g., wheelchair or bedside chair)?: A Little Help needed to walk in hospital room?: A Little Help needed climbing 3-5 steps with a railing? : A Little 6 Click Score: 18    End of Session Equipment Utilized During Treatment: Gait belt Activity Tolerance: Patient tolerated treatment well Patient left: in chair;with call bell/phone within reach;with chair alarm set Nurse Communication: Mobility status PT Visit Diagnosis: Difficulty in walking, not elsewhere classified (R26.2);Pain Pain - Right/Left: Right Pain - part of body: Hip     Time: 1204-1222 PT Time Calculation (min) (ACUTE ONLY): 18 min  Charges:   $Therapeutic Activity: 8-22 mins                     Rica Koyanagi  PTA Acute  Rehabilitation Services Pager      [phone] Office      [phone]

## 2022-01-15 NOTE — Plan of Care (Signed)
  Problem: Education: Goal: Knowledge of General Education information will improve Description: Including pain rating scale, medication(s)/side effects and non-pharmacologic comfort measures Outcome: Progressing   Problem: Health Behavior/Discharge Planning: Goal: Ability to manage health-related needs will improve Outcome: Progressing   Problem: Clinical Measurements: Goal: Will remain free from infection Outcome: Progressing   Problem: Activity: Goal: Risk for activity intolerance will decrease Outcome: Progressing   Problem: Nutrition: Goal: Adequate nutrition will be maintained Outcome: Progressing   Problem: Elimination: Goal: Will not experience complications related to bowel motility Outcome: Progressing Goal: Will not experience complications related to urinary retention Outcome: Progressing   Problem: Safety: Goal: Ability to remain free from injury will improve Outcome: Progressing   Problem: Education: Goal: Knowledge of the prescribed therapeutic regimen will improve Outcome: Progressing   Problem: Pain Management: Goal: Pain level will decrease with appropriate interventions Outcome: Progressing

## 2022-01-15 NOTE — Progress Notes (Signed)
Physical Therapy Treatment Patient Details Name: Derek Hayden MRN: [id number] DOB: 1951/04/17 Today's Date: 01/15/2022   History of Present Illness Pt is a 71yo male presenting s/p R-THA, AA on 01/14/22. PMH: OA, CAD, HLD, HTN, skin cancer, CABG, L foot surgery 2016.`    PT Comments    POD # 1 am session General Comments: AxO x3 very pleasant and motivated.  Retired Presenter, broadcasting for [employer].   Already OOB in recliner.  Assisted with amb in hallway.  Then returned to room to perform some TE's following HEP handout.  Instructed on proper tech, freq as well as use of ICE.   Will see pt again for stair training.   Recommendations for follow up therapy are one component of a multi-disciplinary discharge planning process, led by the attending physician.  Recommendations may be updated based on patient status, additional functional criteria and insurance authorization.  Follow Up Recommendations  Follow physician's recommendations for discharge plan and follow up therapies     Assistance Recommended at Discharge Intermittent Supervision/Assistance  Patient can return home with the following A little help with walking and/or transfers;A little help with bathing/dressing/bathroom;Assistance with cooking/housework;Assist for transportation;Help with stairs or ramp for entrance   Equipment Recommendations  Rolling walker (2 wheels)    Recommendations for Other Services       Precautions / Restrictions Precautions Precautions: Fall Restrictions Weight Bearing Restrictions: No RLE Weight Bearing: Weight bearing as tolerated     Mobility  Bed Mobility               General bed mobility comments: OOB in recliner    Transfers Overall transfer level: Needs assistance Equipment used: Rolling walker (2 wheels) Transfers: Sit to/from Stand Sit to Stand: Supervision, Min guard           General transfer comment: one VC on proper hand placement and safety with turns.     Ambulation/Gait Ambulation/Gait assistance: Supervision Gait Distance (Feet): 75 Feet Assistive device: Rolling walker (2 wheels) Gait Pattern/deviations: Step-to pattern Gait velocity: decreased     General Gait Details: 25% VC's on proper sequencing and safety with turns.   Stairs             Wheelchair Mobility    Modified Rankin (Stroke Patients Only)       Balance                                            Cognition Arousal/Alertness: Awake/alert Behavior During Therapy: WFL for tasks assessed/performed Overall Cognitive Status: Within Functional Limits for tasks assessed                                 General Comments: AxO x3 very pleasant and motivated        Exercises  Total Hip Replacement TE's following HEP Handout 10 reps ankle pumps 05 reps knee presses 05 reps heel slides 05 reps SAQ's 05 reps ABD Instructed how to use a belt loop to assist  Followed by ICE     General Comments        Pertinent Vitals/Pain Pain Assessment Pain Assessment: 0-10 Pain Score: 6  Pain Location: R hip Pain Descriptors / Indicators: Discomfort, Tender, Operative site guarding Pain Intervention(s): Monitored during session, Premedicated before session, Repositioned, Ice applied    Home  Living                          Prior Function            PT Goals (current goals can now be found in the care plan section) Progress towards PT goals: Progressing toward goals    Frequency    7X/week      PT Plan Current plan remains appropriate    Co-evaluation              AM-PAC PT "6 Clicks" Mobility   Outcome Measure  Help needed turning from your back to your side while in a flat bed without using bedrails?: A Little Help needed moving from lying on your back to sitting on the side of a flat bed without using bedrails?: A Little Help needed moving to and from a bed to a chair (including a  wheelchair)?: A Little Help needed standing up from a chair using your arms (e.g., wheelchair or bedside chair)?: A Little Help needed to walk in hospital room?: A Little Help needed climbing 3-5 steps with a railing? : A Little 6 Click Score: 18    End of Session Equipment Utilized During Treatment: Gait belt Activity Tolerance: Patient tolerated treatment well Patient left: in chair;with call bell/phone within reach;with chair alarm set Nurse Communication: Mobility status PT Visit Diagnosis: Difficulty in walking, not elsewhere classified (R26.2);Pain Pain - Right/Left: Right Pain - part of body: Hip     Time: 1002-1030 PT Time Calculation (min) (ACUTE ONLY): 28 min  Charges:  $Gait Training: 8-22 mins $Therapeutic Activity: 8-22 mins                     Rica Koyanagi  PTA Acute  Rehabilitation Services Pager      [phone] Office      [phone]

## 2022-01-16 ENCOUNTER — Other Ambulatory Visit (HOSPITAL_COMMUNITY): Payer: Self-pay

## 2022-01-16 NOTE — Discharge Summary (Signed)
Patient ID: Derek Hayden MRN: [id number] DOB/AGE: 1950/10/27 71 y.o.  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 male who was admitted 01/14/2022 for operative treatment ofS/P total right hip arthroplasty. Patient has severe unremitting pain that affects sleep, daily activities, and work/hobbies. After pre-op clearance the patient was taken to the operating room on 01/14/2022 and underwent  Procedure(s): TOTAL HIP ARTHROPLASTY ANTERIOR APPROACH.    Patient was given perioperative antibiotics:  Anti-infectives (From admission, onward)    Start     Dose/Rate Route Frequency Ordered Stop   01/14/22 1800  ceFAZolin (ANCEF) IVPB 2g/100 mL premix        2 g 200 mL/hr over 30 Minutes Intravenous Every 6 hours 01/14/22 1459 01/15/22 0135   01/14/22 0900  ceFAZolin (ANCEF) IVPB 2g/100 mL premix        2 g 200 mL/hr over 30 Minutes Intravenous On call to O.R. 01/14/22 6767 01/14/22 1201        Patient was given sequential compression  devices, early ambulation, and chemoprophylaxis to prevent DVT. Patient worked with PT and was meeting their goals regarding safe ambulation and transfers.  Patient benefited maximally from hospital stay and there were no complications.    Recent vital signs: No data found.   Recent laboratory studies:  Recent Labs    01/15/22 0317  WBC 9.8  HGB 11.6*  HCT 34.0*  PLT 144*  NA 137  K 4.5  CL 106  CO2 26  BUN 15  CREATININE 0.91  GLUCOSE 133*  CALCIUM 8.5*     Discharge Medications:   Allergies as of 01/15/2022   No Known Allergies      Medication List     STOP taking these medications    aspirin EC 81 MG tablet Replaced by: aspirin 81 MG chewable tablet   indomethacin 25 MG capsule Commonly known as: INDOCIN       TAKE these medications    aspirin 81 MG chewable tablet Chew 1 tablet by mouth 2 times daily for 28 days. Replaces: aspirin EC 81 MG tablet   celecoxib 200 MG capsule Commonly known as: CELEBREX Take 1 capsule by mouth 2 times daily.   cholecalciferol 1000 units tablet Commonly known as: VITAMIN D Take 1,000 Units by mouth daily. Notes to patient: Resume home regimen   CO Q 10 PO Take 1 capsule by mouth daily. Notes to patient: Resume home regimen   docusate sodium 100 MG capsule Commonly  known as: COLACE Take 1 capsule by mouth 2 times daily.   HYDROcodone-acetaminophen 5-325 MG tablet Commonly known as: NORCO/VICODIN Take 1 tablet by mouth every 4 hours as needed for severe pain.   losartan 25 MG tablet Commonly known as: COZAAR TAKE 1 TABLET BY MOUTH ONCE DAILY Notes to patient: Resume home regimen   methocarbamol 500 MG tablet Commonly known as: ROBAXIN Take 1 tablet by mouth every 6 hours as needed for muscle spasms. What changed:  medication strength how much to take how to take this when to take this reasons to take this   metoprolol tartrate 25 MG tablet Commonly known as: LOPRESSOR TAKE 1/2 TABLET BY MOUTH THREE  TIMES DAILY   MULTIVITAMIN PO Take 1 tablet by mouth daily. Notes to patient: Resume home regimen   polyethylene glycol 17 g packet Commonly known as: MIRALAX / GLYCOLAX Mix 17 grams with water and take by mouth daily as needed for mild constipation.   Repatha SureClick 025 MG/ML Soaj Generic drug: Evolocumab INJECT 140 MG INTO THE SKIN EVERY 14 DAYS. Notes to patient: Resume home regimen   SYSTANE OP Place 1 drop into both eyes 2 (two) times daily as needed (dry eyes). Notes to patient: Resume home regimen   VITAMIN C ADULT GUMMIES PO Take 1 tablet by mouth daily. Notes to patient: Resume home regimen               Discharge Care Instructions  (From admission, onward)           Start     Ordered   01/15/22 0000  Change dressing       Comments: Maintain surgical dressing until follow up in the clinic. If the edges start to pull up, may reinforce with tape. If the dressing is no longer working, may remove and cover with gauze and tape, but must keep the area dry and clean.  Call with any questions or concerns.   01/15/22 0749            Diagnostic Studies: DG Pelvis Portable  Result Date: 01/14/2022 CLINICAL DATA:  Status post right total hip arthroplasty. EXAM: PORTABLE PELVIS 1-2 VIEWS COMPARISON:  None Available. FINDINGS: Right acetabular and femoral components are well situated. Expected postoperative changes are seen in the surrounding soft tissues. IMPRESSION: Status post right total hip arthroplasty. Electronically Signed   By: Marijo Conception M.D.   On: 01/14/2022 13:47   DG C-Arm 1-60 Min-No Report  Result Date: 01/14/2022 Fluoroscopy was utilized by the requesting physician.  No radiographic interpretation.   DG C-Arm 1-60 Min-No Report  Result Date: 01/14/2022 Fluoroscopy was utilized by the requesting physician.  No radiographic interpretation.   DG HIP UNILAT WITH PELVIS 2-3 VIEWS RIGHT  Result Date: 01/14/2022 CLINICAL DATA:  Right hip  arthroplasty EXAM: DG HIP (WITH OR WITHOUT PELVIS) 2-3V RIGHT COMPARISON:  None Available. FINDINGS: Intraoperative images during right hip arthroplasty. Normal alignment. No evidence of periprosthetic fracture. IMPRESSION: Intraoperative images during right hip arthroplasty. No immediate complication. Electronically Signed   By: Maurine Simmering M.D.   On: 01/14/2022 13:04    Disposition: Discharge disposition: 01-Home or Self Care       Discharge Instructions     Call MD / Call 911   Complete by: As directed    If you experience chest pain or shortness of breath, CALL 911 and be transported to the hospital emergency room.  If you develope a fever above 101 F, pus (white drainage)  or increased drainage or redness at the wound, or calf pain, call your surgeon's office.   Change dressing   Complete by: As directed    Maintain surgical dressing until follow up in the clinic. If the edges start to pull up, may reinforce with tape. If the dressing is no longer working, may remove and cover with gauze and tape, but must keep the area dry and clean.  Call with any questions or concerns.   Constipation Prevention   Complete by: As directed    Drink plenty of fluids.  Prune juice may be helpful.  You may use a stool softener, such as Colace (over the counter) 100 mg twice a day.  Use MiraLax (over the counter) for constipation as needed.   Diet - low sodium heart healthy   Complete by: As directed    Increase activity slowly as tolerated   Complete by: As directed    Weight bearing as tolerated with assist device (walker, cane, etc) as directed, use it as long as suggested by your surgeon or therapist, typically at least 4-6 weeks.   Post-operative opioid taper instructions:   Complete by: As directed    POST-OPERATIVE OPIOID TAPER INSTRUCTIONS: It is important to wean off of your opioid medication as soon as possible. If you do not need pain medication after your surgery it is ok to stop day  one. Opioids include: Codeine, Hydrocodone(Norco, Vicodin), Oxycodone(Percocet, oxycontin) and hydromorphone amongst others.  Long term and even short term use of opiods can cause: Increased pain response Dependence Constipation Depression Respiratory depression And more.  Withdrawal symptoms can include Flu like symptoms Nausea, vomiting And more Techniques to manage these symptoms Hydrate well Eat regular healthy meals Stay active Use relaxation techniques(deep breathing, meditating, yoga) Do Not substitute Alcohol to help with tapering If you have been on opioids for less than two weeks and do not have pain than it is ok to stop all together.  Plan to wean off of opioids This plan should start within one week post op of your joint replacement. Maintain the same interval or time between taking each dose and first decrease the dose.  Cut the total daily intake of opioids by one tablet each day Next start to increase the time between doses. The last dose that should be eliminated is the evening dose.      TED hose   Complete by: As directed    Use stockings (TED hose) for 2 weeks on both leg(s).  You may remove them at night for sleeping.        Follow-up Information     Paralee Cancel, MD. Schedule an appointment as soon as possible for a visit in 2 week(s).   Specialty: Orthopedic Surgery Contact information: [address] [place] [address] [phone]                  Signed: Irving Copas 01/16/2022, 4:32 PM

## 2022-02-01 ENCOUNTER — Other Ambulatory Visit (HOSPITAL_COMMUNITY): Payer: Self-pay

## 2022-02-15 ENCOUNTER — Other Ambulatory Visit (HOSPITAL_COMMUNITY): Payer: Self-pay

## 2022-02-17 ENCOUNTER — Other Ambulatory Visit: Payer: Self-pay | Admitting: Cardiovascular Disease

## 2022-02-17 ENCOUNTER — Other Ambulatory Visit (HOSPITAL_COMMUNITY): Payer: Self-pay

## 2022-02-17 DIAGNOSIS — E782 Mixed hyperlipidemia: Secondary | ICD-10-CM

## 2022-02-17 DIAGNOSIS — I2510 Atherosclerotic heart disease of native coronary artery without angina pectoris: Secondary | ICD-10-CM

## 2022-02-18 ENCOUNTER — Other Ambulatory Visit (HOSPITAL_COMMUNITY): Payer: Self-pay

## 2022-02-19 ENCOUNTER — Other Ambulatory Visit (HOSPITAL_COMMUNITY): Payer: Self-pay

## 2022-02-19 MED ORDER — REPATHA SURECLICK 140 MG/ML ~~LOC~~ SOAJ
SUBCUTANEOUS | 3 refills | Status: DC
  Filled 2022-02-19: qty 6, 84d supply, fill #0
  Filled 2022-05-24: qty 6, 84d supply, fill #1
  Filled 2022-08-27: qty 6, 84d supply, fill #2
  Filled 2022-11-24 (×2): qty 6, 84d supply, fill #3

## 2022-03-05 ENCOUNTER — Ambulatory Visit: Payer: Medicare Other | Admitting: Dermatology

## 2022-03-05 ENCOUNTER — Encounter: Payer: Self-pay | Admitting: Dermatology

## 2022-03-05 DIAGNOSIS — D485 Neoplasm of uncertain behavior of skin: Secondary | ICD-10-CM

## 2022-03-05 DIAGNOSIS — L820 Inflamed seborrheic keratosis: Secondary | ICD-10-CM

## 2022-03-05 DIAGNOSIS — Z1283 Encounter for screening for malignant neoplasm of skin: Secondary | ICD-10-CM | POA: Diagnosis not present

## 2022-03-05 DIAGNOSIS — C4491 Basal cell carcinoma of skin, unspecified: Secondary | ICD-10-CM

## 2022-03-05 DIAGNOSIS — C44219 Basal cell carcinoma of skin of left ear and external auricular canal: Secondary | ICD-10-CM

## 2022-03-05 DIAGNOSIS — Z85828 Personal history of other malignant neoplasm of skin: Secondary | ICD-10-CM

## 2022-03-05 DIAGNOSIS — L570 Actinic keratosis: Secondary | ICD-10-CM | POA: Diagnosis not present

## 2022-03-05 HISTORY — DX: Basal cell carcinoma of skin, unspecified: C44.91

## 2022-03-05 NOTE — Patient Instructions (Signed)

## 2022-03-11 ENCOUNTER — Telehealth: Payer: Self-pay

## 2022-03-11 NOTE — Telephone Encounter (Signed)
Phone call to patient with his pathology results. Patient aware of his results. MOH's referral sent to [facility].

## 2022-03-12 ENCOUNTER — Other Ambulatory Visit (HOSPITAL_COMMUNITY): Payer: Self-pay

## 2022-03-19 ENCOUNTER — Other Ambulatory Visit (HOSPITAL_COMMUNITY): Payer: Self-pay

## 2022-03-19 MED ORDER — AMOXICILLIN 500 MG PO CAPS
ORAL_CAPSULE | ORAL | 1 refills | Status: AC
  Filled 2022-03-19: qty 9, 3d supply, fill #0
  Filled 2022-06-16: qty 9, 3d supply, fill #1

## 2022-03-29 ENCOUNTER — Encounter: Payer: Self-pay | Admitting: Dermatology

## 2022-03-29 NOTE — Progress Notes (Signed)
   Follow-Up Visit   Subjective  Derek Hayden is a 71 y.o. male who presents for the following: Annual Exam (Patient here today for yearly skin check, per patient he has a rough lesion on his scalp x few months that he would like checked. Per patient he has a lesion on his left ear x months that's raised no bleeding. Check lesion on his right flank that's raised, hard and gray x several months no bleeding. Personal history of non mole skin cancer. No family history of atypical moles, melanoma or non mole skin cancer. ).  Examination, several areas of concern Location:  Duration:  Quality:  Associated Signs/Symptoms: Modifying Factors:  Severity:  Timing: Context:   Objective  Well appearing patient in no apparent distress; mood and affect are within normal limits. 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 All findings within normal limits unless otherwise noted below.   Assessment & Plan    AK (actinic keratosis) Scalp  Destruction of lesion - Scalp Complexity: simple   Destruction method: cryotherapy   Informed consent: discussed and consent obtained   Timeout:  patient name, date of birth, surgical site, and procedure verified Lesion destroyed using liquid nitrogen: Yes   Cryotherapy cycles:  5 Outcome: patient tolerated procedure well with no complications    Neoplasm of uncertain behavior of skin (2) Right Flank  Skin / nail biopsy Type  of biopsy: tangential   Informed consent: discussed and consent obtained   Timeout: patient name, date of birth, surgical site, and procedure verified   Anesthesia: the lesion was anesthetized in a standard fashion   Anesthetic:  1% lidocaine w/ epinephrine 1-100,000 local infiltration Instrument used: flexible razor blade   Hemostasis achieved with: ferric subsulfate   Outcome: patient tolerated procedure well   Post-procedure details: wound care instructions given    Specimen 1 - Surgical pathology Differential Diagnosis: SK  Check Margins: No  Left Mid Helix  Skin / nail biopsy Type of biopsy: tangential   Informed consent: discussed and consent obtained   Timeout: patient name, date of birth, surgical site, and procedure verified   Anesthesia: the lesion was anesthetized in a standard fashion   Anesthetic:  1% lidocaine w/ epinephrine 1-100,000 local infiltration Instrument used: flexible razor blade   Hemostasis achieved with: ferric subsulfate   Outcome: patient tolerated procedure well   Post-procedure details: wound care instructions given    Specimen 2 - Surgical pathology Differential Diagnosis: scc vs bcc  Check Margins: No  Encounter for screening for malignant neoplasm of skin  Annual skin examination      I, Lavonna Monarch, MD, have reviewed all documentation for this visit.  The documentation on 03/29/22 for the exam, diagnosis, procedures, and orders are all accurate and complete.

## 2022-04-08 ENCOUNTER — Other Ambulatory Visit (HOSPITAL_COMMUNITY): Payer: Self-pay

## 2022-04-08 MED FILL — Metoprolol Tartrate Tab 25 MG: ORAL | 90 days supply | Qty: 135 | Fill #3 | Status: AC

## 2022-04-08 MED FILL — Losartan Potassium Tab 25 MG: ORAL | 90 days supply | Qty: 90 | Fill #3 | Status: AC

## 2022-05-21 ENCOUNTER — Other Ambulatory Visit (HOSPITAL_COMMUNITY): Payer: Self-pay

## 2022-05-21 MED ORDER — DICLOFENAC SODIUM 75 MG PO TBEC
75.0000 mg | DELAYED_RELEASE_TABLET | Freq: Two times a day (BID) | ORAL | 1 refills | Status: DC
  Filled 2022-05-21: qty 60, 30d supply, fill #0
  Filled 2022-06-16: qty 60, 30d supply, fill #1

## 2022-05-24 ENCOUNTER — Other Ambulatory Visit (HOSPITAL_COMMUNITY): Payer: Self-pay

## 2022-06-16 ENCOUNTER — Other Ambulatory Visit (HOSPITAL_COMMUNITY): Payer: Self-pay

## 2022-08-27 ENCOUNTER — Other Ambulatory Visit (HOSPITAL_COMMUNITY): Payer: Self-pay

## 2022-09-05 ENCOUNTER — Ambulatory Visit: Payer: Medicare Other | Attending: Cardiovascular Disease | Admitting: Cardiovascular Disease

## 2022-09-05 ENCOUNTER — Encounter: Payer: Self-pay | Admitting: Cardiovascular Disease

## 2022-09-05 VITALS — BP 122/84 | HR 69 | Ht 65.0 in | Wt 195.0 lb

## 2022-09-05 DIAGNOSIS — Z951 Presence of aortocoronary bypass graft: Secondary | ICD-10-CM

## 2022-09-05 DIAGNOSIS — E782 Mixed hyperlipidemia: Secondary | ICD-10-CM | POA: Diagnosis not present

## 2022-09-05 DIAGNOSIS — I10 Essential (primary) hypertension: Secondary | ICD-10-CM | POA: Diagnosis not present

## 2022-09-05 NOTE — Progress Notes (Signed)
09/05/2022 GEDEON BRANDOW   1950/09/09  [id number]  Primary Physician Prince Solian, MD Primary Cardiologist: Lorretta Harp MD Lupe Carney, [place]  HPI:  Derek Hayden is a 72 y.o.  mildly overweight, divorced, Caucasian male father of 49, grandfather to 5 grandchildren who I last saw in the office 06/28/2021.Marland Kitchen He retired 06/27/2022 after working at [employer].. He has a history of CAD status post cath Jan 07, 2011 revealing left main 2-vessel disease with mild LV dysfunction. He ultimately underwent coronary artery bypass grafting x4 by Dr. Tharon Aquas Trigt with a LIMA to his LAD, a vein to a diagonal branch, OM and PDA. His hospital course was uncomplicated, and he recuperated nicely. He had an echo performed April 03, 2011 that was normal with some mild septal hypokinesia and a Myoview that was normal as well.    He exercises on a daily basis doing to 200 crunches in the morning and 150 pushups.  He also does a total body workout 3 days a week.  He has also had his left ankle operated on since I saw him last and has recuperated nicely. He has bilateral rotator cuff tears and will need to have this addressed in the future as well.   Since I saw him in the office a year ago he is completely asymptomatic.  He was placed on Repatha and has an excellent lipid profile with an LDL of 64.  He retired in November 2023 and since then has been exercising and denies chest pain or shortness of breath.  Current Meds  Medication Sig   Ascorbic Acid (VITAMIN C ADULT GUMMIES PO) Take 1 tablet by mouth daily.   aspirin EC 81 MG tablet Take two tablets daily Oral   cholecalciferol (VITAMIN D) 1000 units tablet Take 1,000 Units by mouth daily.   Coenzyme Q10 (CO Q 10 PO) Take 1 capsule by mouth daily.   Evolocumab (REPATHA SURECLICK) 431 MG/ML SOAJ INJECT 140 MG INTO THE SKIN EVERY 14 DAYS.   losartan (COZAAR) 25 MG tablet TAKE 1 TABLET BY MOUTH ONCE DAILY   metoprolol tartrate (LOPRESSOR) 25 MG tablet  TAKE 1/2 TABLET BY MOUTH THREE TIMES DAILY   Multiple Vitamin (MULTIVITAMIN PO) Take 1 tablet by mouth daily.       No Known Allergies  Social History   Socioeconomic History   Marital status: Single    Spouse name: Not on file   Number of children: Not on file   Years of education: Not on file   Highest education level: Not on file  Occupational History   Not on file  Tobacco Use   Smoking status: Never   Smokeless tobacco: Never  Vaping Use   Vaping Use: Never used  Substance and Sexual Activity   Alcohol use: Yes    Comment: occas.   Drug use: No   Sexual activity: Not on file  Other Topics Concern   Not on file  Social History Narrative   Not on file   Social Determinants of Health   Financial Resource Strain: Not on file  Food Insecurity: Not on file  Transportation Needs: Not on file  Physical Activity: Not on file  Stress: Not on file  Social Connections: Not on file  Intimate Partner Violence: Not on file     Review of Systems: General: negative for chills, fever, night sweats or weight changes.  Cardiovascular: negative for chest pain, dyspnea on exertion, edema, orthopnea, palpitations, paroxysmal nocturnal dyspnea  or shortness of breath Dermatological: negative for rash Respiratory: negative for cough or wheezing Urologic: negative for hematuria Abdominal: negative for nausea, vomiting, diarrhea, bright red blood per rectum, melena, or hematemesis Neurologic: negative for visual changes, syncope, or dizziness All other systems reviewed and are otherwise negative except as noted above.    Blood pressure 122/84, pulse 69, height '5\' 5"'$  (1.651 m), weight 195 lb (88.5 kg), SpO2 94 %.  General appearance: alert and no distress Neck: no adenopathy, no carotid bruit, no JVD, supple, symmetrical, trachea midline, and thyroid not enlarged, symmetric, no tenderness/mass/nodules Lungs: clear to auscultation bilaterally Heart: regular rate and rhythm, S1, S2  normal, no murmur, click, rub or gallop Extremities: extremities normal, atraumatic, no cyanosis or edema Pulses: 2+ and symmetric Skin: Skin color, texture, turgor normal. No rashes or lesions Neurologic: Grossly normal  EKG sinus rhythm at 69 with T wave inversion in leads I and L.  Personally reviewed this EKG.  ASSESSMENT AND PLAN:   S/P CABG x 4: Jan 08, 2011,  Dr. Prescott Gum, LIMA to the LAD, SVG to diag, SVG to OM, SVG to PDA.   History of CAD status post coronary artery bypass grafting x 4 by Dr. Dahlia Byes 01/07/2011 with a LIMA to the LAD, vein to diagonal branch, OM and PDA.  He is completely asymptomatic.  HTN (hypertension) History of essential hypertension blood pressure measured today at 122/84.  He is on losartan and metoprolol.  Hyperlipidemia History of hyperlipidemia on Repatha with lipid profile performed 09/03/2022 revealing total cholesterol 124, LDL 63 and HDL 40.     Lorretta Harp MD FACP,FACC,FAHA, [facility] 09/05/2022 3:34 PM

## 2022-09-05 NOTE — Patient Instructions (Signed)
Medication Instructions:  The current medical regimen is effective;  continue present plan and medications.  *If you need a refill on your cardiac medications before your next appointment, please call your pharmacy*   Follow-Up: At [facility], you and your health needs are our priority.  As part of our continuing mission to provide you with exceptional heart care, we have created designated Provider Care Teams.  These Care Teams include your primary Cardiologist (physician) and Advanced Practice Providers (APPs -  Physician Assistants and Nurse Practitioners) who all work together to provide you with the care you need, when you need it.  We recommend signing up for the patient portal called "MyChart".  Sign up information is provided on this After Visit Summary.  MyChart is used to connect with patients for Virtual Visits (Telemedicine).  Patients are able to view lab/test results, encounter notes, upcoming appointments, etc.  Non-urgent messages can be sent to your provider as well.   To learn more about what you can do with MyChart, go to [email or web address].    Your next appointment:   12 month(s)  Provider:   Quay Burow, MD

## 2022-09-05 NOTE — Assessment & Plan Note (Signed)
History of hyperlipidemia on Repatha with lipid profile performed 09/03/2022 revealing total cholesterol 124, LDL 63 and HDL 40.

## 2022-09-05 NOTE — Assessment & Plan Note (Signed)
History of essential hypertension blood pressure measured today at 122/84.  He is on losartan and metoprolol.

## 2022-09-05 NOTE — Assessment & Plan Note (Signed)
History of CAD status post coronary artery bypass grafting x 4 by Dr. Dahlia Byes 01/07/2011 with a LIMA to the LAD, vein to diagonal branch, OM and PDA.  He is completely asymptomatic.

## 2022-09-11 ENCOUNTER — Other Ambulatory Visit (HOSPITAL_COMMUNITY): Payer: Self-pay

## 2022-09-11 MED ORDER — PANTOPRAZOLE SODIUM 40 MG PO TBEC
DELAYED_RELEASE_TABLET | ORAL | 1 refills | Status: DC
Start: 2022-09-10 — End: 2023-09-07
  Filled 2022-09-11: qty 30, 30d supply, fill #0

## 2022-09-15 ENCOUNTER — Other Ambulatory Visit (HOSPITAL_COMMUNITY): Payer: Self-pay

## 2022-10-07 ENCOUNTER — Other Ambulatory Visit: Payer: Self-pay | Admitting: Cardiovascular Disease

## 2022-10-07 ENCOUNTER — Other Ambulatory Visit (HOSPITAL_COMMUNITY): Payer: Self-pay

## 2022-10-07 MED ORDER — METOPROLOL TARTRATE 25 MG PO TABS
ORAL_TABLET | Freq: Three times a day (TID) | ORAL | 3 refills | Status: DC
Start: 2022-10-07 — End: 2023-10-06
  Filled 2022-10-07: qty 135, 90d supply, fill #0
  Filled 2023-01-14: qty 135, 90d supply, fill #1
  Filled 2023-04-10: qty 135, 90d supply, fill #2
  Filled 2023-07-13: qty 135, 90d supply, fill #3

## 2022-10-07 MED ORDER — LOSARTAN POTASSIUM 25 MG PO TABS
ORAL_TABLET | Freq: Every day | ORAL | 3 refills | Status: DC
  Filled 2022-10-07: qty 90, 90d supply, fill #0
  Filled 2023-01-14: qty 90, 90d supply, fill #1
  Filled 2023-04-10: qty 90, 90d supply, fill #2
  Filled 2023-07-13: qty 90, 90d supply, fill #3

## 2022-11-24 ENCOUNTER — Other Ambulatory Visit (HOSPITAL_COMMUNITY): Payer: Self-pay

## 2023-01-14 ENCOUNTER — Other Ambulatory Visit (HOSPITAL_COMMUNITY): Payer: Self-pay

## 2023-02-23 ENCOUNTER — Other Ambulatory Visit: Payer: Self-pay | Admitting: Cardiovascular Disease

## 2023-02-23 ENCOUNTER — Other Ambulatory Visit (HOSPITAL_COMMUNITY): Payer: Self-pay

## 2023-02-23 DIAGNOSIS — E782 Mixed hyperlipidemia: Secondary | ICD-10-CM

## 2023-02-23 DIAGNOSIS — I2510 Atherosclerotic heart disease of native coronary artery without angina pectoris: Secondary | ICD-10-CM

## 2023-02-23 MED ORDER — REPATHA SURECLICK 140 MG/ML ~~LOC~~ SOAJ
SUBCUTANEOUS | 3 refills | Status: DC
  Filled 2023-02-23: qty 6, 84d supply, fill #0
  Filled 2023-05-15: qty 6, 84d supply, fill #1

## 2023-04-10 ENCOUNTER — Other Ambulatory Visit (HOSPITAL_COMMUNITY): Payer: Self-pay

## 2023-05-15 ENCOUNTER — Other Ambulatory Visit (HOSPITAL_COMMUNITY): Payer: Self-pay

## 2023-07-13 ENCOUNTER — Other Ambulatory Visit (HOSPITAL_COMMUNITY): Payer: Self-pay

## 2023-07-14 ENCOUNTER — Other Ambulatory Visit (HOSPITAL_COMMUNITY): Payer: Self-pay

## 2023-09-07 ENCOUNTER — Encounter: Payer: Self-pay | Admitting: Cardiovascular Disease

## 2023-09-07 ENCOUNTER — Other Ambulatory Visit (HOSPITAL_COMMUNITY): Payer: Self-pay

## 2023-09-07 ENCOUNTER — Ambulatory Visit: Payer: Medicare Other | Attending: Cardiovascular Disease | Admitting: Cardiovascular Disease

## 2023-09-07 VITALS — BP 126/74 | HR 69 | Ht 65.0 in | Wt 187.8 lb

## 2023-09-07 DIAGNOSIS — I10 Essential (primary) hypertension: Secondary | ICD-10-CM

## 2023-09-07 DIAGNOSIS — I2510 Atherosclerotic heart disease of native coronary artery without angina pectoris: Secondary | ICD-10-CM

## 2023-09-07 DIAGNOSIS — Z951 Presence of aortocoronary bypass graft: Secondary | ICD-10-CM

## 2023-09-07 DIAGNOSIS — E782 Mixed hyperlipidemia: Secondary | ICD-10-CM

## 2023-09-07 MED ORDER — REPATHA SURECLICK 140 MG/ML ~~LOC~~ SOAJ
SUBCUTANEOUS | 3 refills | Status: DC
  Filled 2023-09-07: qty 6, 84d supply, fill #0
  Filled 2023-11-21: qty 6, 84d supply, fill #1
  Filled 2024-02-15: qty 6, 84d supply, fill #2
  Filled 2024-04-27: qty 6, 84d supply, fill #3

## 2023-09-07 NOTE — Assessment & Plan Note (Signed)
 History of CAD status post cardiac cath which I did Jan 07, 2011 revealing left main three-vessel disease.  The following day he had CABG x 4 by Dr. Obadiah with a LIMA to the LAD, vein to diagonal branch, obtuse marginal branch and PDA.  He has done well since and has been asymptomatic.

## 2023-09-07 NOTE — Assessment & Plan Note (Signed)
 History of essential hypertension her blood pressure measured today 126/74.  He is on losartan and metoprolol.

## 2023-09-07 NOTE — Assessment & Plan Note (Signed)
 History of hyperlipidemia on Repatha  which unfortunately has not taken for the last 3 months.  His last lipid profile performed 09/03/2022 revealed total cholesterol 124, LDL 63 and HDL 40.  We will write him a prescription for his Repatha  and recheck a lipid profile 3 months.

## 2023-09-07 NOTE — Progress Notes (Signed)
 09/07/2023 Derek Hayden   05-14-1951  [id number]  Primary Physician Janey Santos, MD Primary Cardiologist: Dorn JINNY Lesches MD FACP, [place], [place], [place]  HPI:  Derek Hayden is a 73 y.o.  mildly overweight, divorced, Caucasian male father of 1, grandfather to 5 grandchildren who I last saw in the office 06/28/2021.SABRA He retired 06/27/2022 after working at [employer]SABRA He has a history of CAD status post cath Jan 07, 2011 revealing left main 2-vessel disease with mild LV dysfunction. He ultimately underwent coronary artery bypass grafting x4 by Dr. Maude Salinas Trigt with a LIMA to his LAD, a vein to a diagonal branch, OM and PDA. His hospital course was uncomplicated, and he recuperated nicely. He had an echo performed April 03, 2011 that was normal with some mild septal hypokinesia and a Myoview that was normal as well.    He exercises on a daily basis doing to 200 crunches in the morning and 150 pushups.  He also does a total body workout 3 days a week.  He has also had his left ankle operated on since I saw him last and has recuperated nicely. He has bilateral rotator cuff tears and will need to have this addressed in the future as well.   Since I saw him in the office a year ago he is completely asymptomatic.  He was placed on Repatha  and has an excellent lipid profile with an LDL of 64.  He retired in November 2023 and since then has been exercising and denies chest pain but has shortness of breath.   Current Meds  Medication Sig   amoxicillin  (AMOXIL ) 500 MG capsule Take 3 capsules by mouth 1 hour before dental work.   Ascorbic Acid (VITAMIN C ADULT GUMMIES PO) Take 1 tablet by mouth daily.   aspirin  EC 81 MG tablet Take two tablets daily Oral   celecoxib  (CELEBREX ) 200 MG capsule Take 1 capsule by mouth 2 times daily.   cholecalciferol (VITAMIN D) 1000 units tablet Take 1,000 Units by mouth daily.   Cholecalciferol 25 MCG (1000 UT) capsule    Coenzyme Q10 (CO Q 10 PO) Take 1 capsule by  mouth daily.   metoprolol  tartrate (LOPRESSOR ) 25 MG tablet TAKE 1/2 TABLET BY MOUTH 3 TIMES DAILY   Multiple Vitamin (MULTIVITAMIN PO) Take 1 tablet by mouth daily.     Polyethyl Glycol-Propyl Glycol (SYSTANE OP) Place 1 drop into both eyes 2 (two) times daily as needed (dry eyes).     No Known Allergies  Social History   Socioeconomic History   Marital status: Single    Spouse name: Not on file   Number of children: Not on file   Years of education: Not on file   Highest education level: Not on file  Occupational History   Not on file  Tobacco Use   Smoking status: Never   Smokeless tobacco: Never  Vaping Use   Vaping status: Never Used  Substance and Sexual Activity   Alcohol use: Yes    Comment: occas.   Drug use: No   Sexual activity: Not on file  Other Topics Concern   Not on file  Social History Narrative   Not on file   Social Drivers of Health   Financial Resource Strain: Not on file  Food Insecurity: Not on file  Transportation Needs: Not on file  Physical Activity: Not on file  Stress: Not on file  Social Connections: Not on file  Intimate Partner Violence: Not on  file     Review of Systems: General: negative for chills, fever, night sweats or weight changes.  Cardiovascular: negative for chest pain, dyspnea on exertion, edema, orthopnea, palpitations, paroxysmal nocturnal dyspnea or shortness of breath Dermatological: negative for rash Respiratory: negative for cough or wheezing Urologic: negative for hematuria Abdominal: negative for nausea, vomiting, diarrhea, bright red blood per rectum, melena, or hematemesis Neurologic: negative for visual changes, syncope, or dizziness All other systems reviewed and are otherwise negative except as noted above.    Blood pressure 126/74, pulse 69, height 5' 5 (1.651 m), weight 187 lb 12.8 oz (85.2 kg), SpO2 97%.  General appearance: alert and no distress Neck: no adenopathy, no carotid bruit, no JVD, supple,  symmetrical, trachea midline, and thyroid not enlarged, symmetric, no tenderness/mass/nodules Lungs: clear to auscultation bilaterally Heart: regular rate and rhythm, S1, S2 normal, no murmur, click, rub or gallop Extremities: extremities normal, atraumatic, no cyanosis or edema Pulses: 2+ and symmetric Skin: Skin color, texture, turgor normal. No rashes or lesions Neurologic: Grossly normal  EKG EKG Interpretation Date/Time:  Monday September 07 2023 13:28:42 EST Ventricular Rate:  69 PR Interval:  150 QRS Duration:  86 QT Interval:  396 QTC Calculation: 424 R Axis:   7  Text Interpretation: Normal sinus rhythm Abnormal QRS-T angle, consider primary T wave abnormality When compared with ECG of 09-Jan-2011 07:18, Sinus rhythm has replaced Junctional rhythm Criteria for Inferior infarct are no longer Present ST no longer elevated in Anterior leads T wave inversion no longer evident in Inferior leads T wave inversion less evident in Lateral leads Confirmed by Court Carrier [phone]) on 09/07/2023 1:30:05 PM    ASSESSMENT AND PLAN:   S/P CABG x 4: Jan 08, 2011,  Dr. Fleeta Ochoa, LIMA to the LAD, SVG to diag, SVG to OM, SVG to PDA.   History of CAD status post cardiac cath which I did Jan 07, 2011 revealing left main three-vessel disease.  The following day he had CABG x 4 by Dr. Obadiah with a LIMA to the LAD, vein to diagonal branch, obtuse marginal branch and PDA.  He has done well since and has been asymptomatic.  HTN (hypertension) History of essential hypertension her blood pressure measured today 126/74.  He is on losartan  and metoprolol .  Hyperlipidemia History of hyperlipidemia on Repatha  which unfortunately has not taken for the last 3 months.  His last lipid profile performed 09/03/2022 revealed total cholesterol 124, LDL 63 and HDL 40.  We will write him a prescription for his Repatha  and recheck a lipid profile 3 months.     Carrier DOROTHA Court MD FACP,FACC,FAHA,  [facility] 09/07/2023 1:42 PM

## 2023-09-07 NOTE — Patient Instructions (Signed)
 Medication Instructions:  Your physician has recommended you make the following change in your medication:   -Re-start Repatha  140mg  once every 14 days.  *If you need a refill on your cardiac medications before your next appointment, please call your pharmacy*   Lab Work: Your physician recommends that you return for lab work in: 3 months for FASTING lipids  If you have labs (blood work) drawn today and your tests are completely normal, you will receive your results only by: MyChart Message (if you have MyChart) OR A paper copy in the mail If you have any lab test that is abnormal or we need to change your treatment, we will call you to review the results.    Follow-Up: At [facility], you and your health needs are our priority.  As part of our continuing mission to provide you with exceptional heart care, we have created designated Provider Care Teams.  These Care Teams include your primary Cardiologist (physician) and Advanced Practice Providers (APPs -  Physician Assistants and Nurse Practitioners) who all work together to provide you with the care you need, when you need it.  We recommend signing up for the patient portal called MyChart.  Sign up information is provided on this After Visit Summary.  MyChart is used to connect with patients for Virtual Visits (Telemedicine).  Patients are able to view lab/test results, encounter notes, upcoming appointments, etc.  Non-urgent messages can be sent to your provider as well.   To learn more about what you can do with MyChart, go to [email or web address].    Your next appointment:   12 month(s)  Provider:   Dorn Lesches, MD     Other Instructions

## 2023-09-08 ENCOUNTER — Other Ambulatory Visit (HOSPITAL_COMMUNITY): Payer: Self-pay

## 2023-09-08 ENCOUNTER — Other Ambulatory Visit: Payer: Self-pay

## 2023-10-06 ENCOUNTER — Other Ambulatory Visit: Payer: Self-pay | Admitting: Cardiovascular Disease

## 2023-10-06 ENCOUNTER — Other Ambulatory Visit (HOSPITAL_COMMUNITY): Payer: Self-pay

## 2023-10-07 ENCOUNTER — Other Ambulatory Visit (HOSPITAL_COMMUNITY): Payer: Self-pay

## 2023-10-07 MED ORDER — METOPROLOL TARTRATE 25 MG PO TABS
ORAL_TABLET | Freq: Three times a day (TID) | ORAL | 3 refills | Status: AC
  Filled 2023-10-07: qty 135, 90d supply, fill #0
  Filled 2024-01-29: qty 135, 90d supply, fill #1
  Filled 2024-04-27: qty 135, 90d supply, fill #2
  Filled 2024-07-26: qty 135, 90d supply, fill #3

## 2023-10-07 MED ORDER — LOSARTAN POTASSIUM 25 MG PO TABS
ORAL_TABLET | Freq: Every day | ORAL | 3 refills | Status: AC
  Filled 2023-10-07: qty 90, 90d supply, fill #0
  Filled 2024-01-29: qty 90, 90d supply, fill #1
  Filled 2024-04-27: qty 90, 90d supply, fill #2
  Filled 2024-07-26: qty 90, 90d supply, fill #3

## 2023-11-21 ENCOUNTER — Other Ambulatory Visit (HOSPITAL_COMMUNITY): Payer: Self-pay

## 2023-11-24 LAB — LIPID PANEL
Chol/HDL Ratio: 2.5 ratio (ref 0.0–5.0)
Cholesterol, Total: 119 mg/dL (ref 100–199)
HDL: 48 mg/dL (ref >39)
LDL Chol Calc (NIH): 54 mg/dL (ref 0–99)
Triglycerides: 84 mg/dL (ref 0–149)
VLDL Cholesterol Cal: 17 mg/dL (ref 5–40)

## 2023-12-08 ENCOUNTER — Other Ambulatory Visit: Payer: Self-pay | Admitting: Urology

## 2023-12-08 DIAGNOSIS — R972 Elevated prostate specific antigen [PSA]: Secondary | ICD-10-CM

## 2023-12-15 ENCOUNTER — Ambulatory Visit (HOSPITAL_COMMUNITY)
Admission: RE | Admit: 2023-12-15 | Discharge: 2023-12-15 | Disposition: A | Discharge status: Home / Self Care | Source: Ambulatory Visit | Attending: Urology | Admitting: Urology

## 2023-12-15 ENCOUNTER — Encounter (HOSPITAL_COMMUNITY): Payer: Self-pay

## 2023-12-15 DIAGNOSIS — R972 Elevated prostate specific antigen [PSA]: Secondary | ICD-10-CM | POA: Insufficient documentation

## 2023-12-22 ENCOUNTER — Ambulatory Visit (HOSPITAL_COMMUNITY)
Admission: RE | Admit: 2023-12-22 | Discharge: 2023-12-22 | Disposition: A | Discharge status: Home / Self Care | Source: Ambulatory Visit | Attending: Urology | Admitting: Urology

## 2023-12-22 DIAGNOSIS — R972 Elevated prostate specific antigen [PSA]: Secondary | ICD-10-CM | POA: Diagnosis present

## 2023-12-22 MED ORDER — GADOBUTROL 1 MMOL/ML IV SOLN
10.0000 mL | Freq: Once | INTRAVENOUS | Status: AC | PRN
  Administered 2023-12-22: 10 mL via INTRAVENOUS

## 2024-01-29 ENCOUNTER — Other Ambulatory Visit (HOSPITAL_COMMUNITY): Payer: Self-pay

## 2024-02-15 ENCOUNTER — Other Ambulatory Visit (HOSPITAL_COMMUNITY): Payer: Self-pay

## 2024-04-27 ENCOUNTER — Other Ambulatory Visit (HOSPITAL_COMMUNITY): Payer: Self-pay

## 2024-04-28 ENCOUNTER — Other Ambulatory Visit (HOSPITAL_COMMUNITY): Payer: Self-pay

## 2024-07-26 ENCOUNTER — Other Ambulatory Visit (HOSPITAL_COMMUNITY): Payer: Self-pay

## 2024-07-26 ENCOUNTER — Other Ambulatory Visit: Payer: Self-pay | Admitting: Cardiovascular Disease

## 2024-07-26 DIAGNOSIS — E782 Mixed hyperlipidemia: Secondary | ICD-10-CM

## 2024-07-26 DIAGNOSIS — I2510 Atherosclerotic heart disease of native coronary artery without angina pectoris: Secondary | ICD-10-CM

## 2024-07-27 ENCOUNTER — Other Ambulatory Visit (HOSPITAL_COMMUNITY): Payer: Self-pay

## 2024-07-27 MED ORDER — REPATHA SURECLICK 140 MG/ML ~~LOC~~ SOAJ
140.0000 mg | SUBCUTANEOUS | 3 refills | Status: AC
  Filled 2024-07-27: qty 6, 84d supply, fill #0

## 2024-08-01 ENCOUNTER — Other Ambulatory Visit: Payer: Self-pay | Admitting: Medical Genetics

## 2024-08-31 ENCOUNTER — Encounter: Payer: Self-pay | Admitting: Cardiovascular Disease
